# Patient Record
Sex: Female | Born: 1955 | Race: White | Hispanic: No | Marital: Married | Smoking: Never smoker
Health system: Southern US, Community
[De-identification: ages and names within clinical notes are randomized; demographics above are authoritative.]

## PROBLEM LIST (undated history)

## (undated) DIAGNOSIS — M722 Plantar fascial fibromatosis: Secondary | ICD-10-CM

## (undated) DIAGNOSIS — T8859XA Other complications of anesthesia, initial encounter: Secondary | ICD-10-CM

## (undated) DIAGNOSIS — H20813 Fuchs' heterochromic cyclitis, bilateral: Secondary | ICD-10-CM

## (undated) DIAGNOSIS — Z8742 Personal history of other diseases of the female genital tract: Secondary | ICD-10-CM

## (undated) DIAGNOSIS — Z8489 Family history of other specified conditions: Secondary | ICD-10-CM

## (undated) DIAGNOSIS — Z9889 Other specified postprocedural states: Secondary | ICD-10-CM

## (undated) DIAGNOSIS — R112 Nausea with vomiting, unspecified: Secondary | ICD-10-CM

## (undated) DIAGNOSIS — T4145XA Adverse effect of unspecified anesthetic, initial encounter: Secondary | ICD-10-CM

## (undated) HISTORY — PX: DILATION AND CURETTAGE OF UTERUS: SHX78

## (undated) HISTORY — DX: Personal history of other diseases of the female genital tract: Z87.42

## (undated) HISTORY — DX: Plantar fascial fibromatosis: M72.2

## (undated) HISTORY — DX: Fuchs' heterochromic cyclitis, bilateral: H20.813

## (undated) HISTORY — PX: OTHER SURGICAL HISTORY: SHX169

---

## 1999-07-29 ENCOUNTER — Other Ambulatory Visit: Admission: RE | Admit: 1999-07-29 | Discharge: 1999-07-29 | Payer: Self-pay | Admitting: *Deleted

## 2001-02-06 ENCOUNTER — Other Ambulatory Visit: Admission: RE | Admit: 2001-02-06 | Discharge: 2001-02-06 | Payer: Self-pay | Admitting: *Deleted

## 2001-02-10 ENCOUNTER — Encounter: Admission: RE | Admit: 2001-02-10 | Discharge: 2001-02-10 | Payer: Self-pay | Admitting: *Deleted

## 2001-02-10 ENCOUNTER — Encounter: Payer: Self-pay | Admitting: *Deleted

## 2001-02-15 DIAGNOSIS — Z8742 Personal history of other diseases of the female genital tract: Secondary | ICD-10-CM

## 2001-02-15 HISTORY — DX: Personal history of other diseases of the female genital tract: Z87.42

## 2002-03-22 ENCOUNTER — Other Ambulatory Visit: Admission: RE | Admit: 2002-03-22 | Discharge: 2002-03-22 | Payer: Self-pay | Admitting: *Deleted

## 2002-10-05 ENCOUNTER — Ambulatory Visit (HOSPITAL_BASED_OUTPATIENT_CLINIC_OR_DEPARTMENT_OTHER): Admission: RE | Admit: 2002-10-05 | Discharge: 2002-10-05 | Payer: Self-pay | Admitting: Obstetrics and Gynecology

## 2002-10-18 DIAGNOSIS — M722 Plantar fascial fibromatosis: Secondary | ICD-10-CM

## 2002-10-18 HISTORY — DX: Plantar fascial fibromatosis: M72.2

## 2003-03-27 ENCOUNTER — Other Ambulatory Visit: Admission: RE | Admit: 2003-03-27 | Discharge: 2003-03-27 | Payer: Self-pay | Admitting: *Deleted

## 2004-08-21 ENCOUNTER — Other Ambulatory Visit: Admission: RE | Admit: 2004-08-21 | Discharge: 2004-08-21 | Payer: Self-pay | Admitting: *Deleted

## 2006-12-23 ENCOUNTER — Other Ambulatory Visit: Admission: RE | Admit: 2006-12-23 | Discharge: 2006-12-23 | Payer: Self-pay | Admitting: Obstetrics & Gynecology

## 2011-03-05 NOTE — Op Note (Signed)
NAMEANAISABEL, PEDERSON                       ACCOUNT NO.:  [id number]   MEDICAL RECORD NO.:  [id number]                   PATIENT TYPE:  AMB   LOCATION:  [facility]                                 FACILITY:  [facility]   PHYSICIAN:  Laqueta Linden, M.D.                 DATE OF BIRTH:  1956-08-26   DATE OF PROCEDURE:  10/05/2002  DATE OF DISCHARGE:                                 OPERATIVE REPORT   PREOPERATIVE DIAGNOSIS:  Chronic left lower quadrant pain.   POSTOPERATIVE DIAGNOSIS:  Chronic left lower quadrant pain, left adnexal  adhesions, left uterosacral ligament endometriosis implant.   PROCEDURE:  Diagnostic laparoscopy with cauterization of implant and lysis  of adhesions.   SURGEON:  Laqueta Linden, M.D.   ANESTHESIA:  General endotracheal.   ESTIMATED BLOOD LOSS:  Less than 10 cc.   SPECIMENS:  None.   COMPLICATIONS:  None.   INDICATIONS FOR PROCEDURE:  Kamaryn Grimley is a 55 year old, gravida 3,  para 3, married white female, who has a greater than six month history of  left lower quadrant pain with associated chronic constipation as well as  dyspareunia.  She is quite tender on pelvic examination in the left aspect  of her uterus and adnexa as well as on rectal.  She is to undergo  diagnostics and hopefully therapeutic laparoscopy.  She has seen the  informed consent film with her husband.  They voiced their understanding and  accepted risks, benefits, and alternatives and complications as well as the  limitations of the procedure and the possible need for further GI evaluation  including colonoscopy postoperatively.  She voices her understanding and  acceptance of all of the risks and agrees to proceed.  She also understands  that this may not be a definitively therapeutic procedure regardless of the  findings.  The patient has received Ancef 1 g IV antibiotic prophylaxis  preoperatively.   DESCRIPTION OF PROCEDURE:  The patient was taken to the operating room and  after proper identification and consents were ascertained, she was placed on  the operating room table in the supine position.  After the induction of  general endotracheal anesthesia, she was placed in the [place] stirrups, and  the abdomen, perineum, and vagina were prepped and draped in a routine  sterile fashion.  A transurethral Foley was placed which was removed at the  conclusion of the procedure.  A Hulka tenaculum was placed on the anterior  lip of the cervix.  Attention was then turned abdominally.  A 2 cm  infraumbilical incision was made.  The Veress needle was inserted into the  peritoneal cavity.  Intraperitoneal placement was confirmed by hanging drop  and saline instillation tests.  Three liters of CO2 were then infused, and  the Veress needle was then removed.  The #10-11 disposable trocar was then  inserted without difficulty.  Inspection with the scope revealed no obvious  injury  or bleeding at the insertion site.  A 5 mm port was placed on the  right mid quadrant for an accessory port under direct vision.  Inspection of  the upper abdomen revealed smooth liver edge.  The appendix was normal in  appearance without evidence of inflammation or adhesions.  Inspection of the  pelvis revealed a freely mobilize uterus.  Completely normal right tube and  ovary.  All peritoneal surfaces were visualized, and the only area of  question was on the left uterosacral ligament where there were two adjacent  white blebs, consistent with endometriosis.  There were no peritoneal  windows or defects or any other suspicious-looking lesions.  The left tube  and ovary appeared completely normal with no evidence of endometriosis;  however, left adnexal mobility was limited by some filmy adhesions to the  left pelvic sidewall which was felt to likely be responsible for her pain.  No other abnormalities were identified.  The monopolar scissors were then  utilized and the adhesions on the left adnexa  put on stretch, and these  adhesions were sharply lysed with cautery with excellent hemostasis and  excellent freeing up of that adnexa noted.  Attention was then turned to the  endometriosis implant.  This was cauterized with the Kleppinger forceps.  The 5 mm port was then removed under direct vision.  The pneumoperitoneum  was allowed to escape, and the laparoscope was then withdrawn under direct  vision.  The incisions were sutured with 4-0 Dexon subcuticular sutures, and  Steri-Strips and pressure dressings were applied.  A total of 10 cc of 0.25%  plain Marcaine was injected into the incisions for local analgesia.  The  patient received 30 mg Toradol IV, 30 mg IM prior to the conclusion of the  procedure.  She will be observed and discharged per anesthesia protocol.  She is to follow up in the office in 4-6 weeks' time.  She is to call sooner  for excessive pain, fever, bleeding, or other concerns.  She will be given  routine verbal and written discharge instructions.  She is to take Advil or  Aleve and all routine medications and given a prescription for Percocet  5/325 dispense #20, 1-2 q.4-6h. p.r.n. pain with no refills.                                               Laqueta Linden, M.D.    LKS/MEDQ  D:  10/05/2002  T:  10/05/2002  Job:  [id number]

## 2013-03-05 ENCOUNTER — Encounter: Payer: Self-pay | Admitting: Gynecology

## 2013-03-09 ENCOUNTER — Ambulatory Visit: Payer: Self-pay | Admitting: Gynecology

## 2013-03-09 DIAGNOSIS — Z01419 Encounter for gynecological examination (general) (routine) without abnormal findings: Secondary | ICD-10-CM

## 2013-06-07 ENCOUNTER — Ambulatory Visit (INDEPENDENT_AMBULATORY_CARE_PROVIDER_SITE_OTHER): Payer: BC Managed Care – PPO | Admitting: Gynecology

## 2013-06-07 ENCOUNTER — Encounter: Payer: Self-pay | Admitting: Gynecology

## 2013-06-07 VITALS — BP 122/80 | HR 68 | Resp 12 | Ht 69.0 in | Wt 171.0 lb

## 2013-06-07 DIAGNOSIS — H20819 Fuchs' heterochromic cyclitis, unspecified eye: Secondary | ICD-10-CM | POA: Insufficient documentation

## 2013-06-07 DIAGNOSIS — H20813 Fuchs' heterochromic cyclitis, bilateral: Secondary | ICD-10-CM

## 2013-06-07 DIAGNOSIS — N952 Postmenopausal atrophic vaginitis: Secondary | ICD-10-CM | POA: Insufficient documentation

## 2013-06-07 DIAGNOSIS — N951 Menopausal and female climacteric states: Secondary | ICD-10-CM | POA: Insufficient documentation

## 2013-06-07 MED ORDER — ESTRADIOL 10 MCG VA TABS: 1.0000 | ORAL_TABLET | VAGINAL | Status: DC

## 2013-06-07 MED ORDER — CONJ ESTROGENS-BAZEDOXIFENE 0.45-20 MG PO TABS: 1.0000 | ORAL_TABLET | ORAL | Status: DC

## 2013-06-07 NOTE — Progress Notes (Signed)
Subjective:     Patient ID: Jasmin Woods, female   DOB: 1956-07-06, 57 y.o.   MRN: [id number]  HPI Comments: Pt here as follow up on Duavee HRT.  Pt reports tolerating well only questions increase in BP.Marland Kitchen One reason for ERT was history of Fuchs Heterochromic Iridocystitis, followed at [facility], that aspect is better.  Pt reports hot flashes if forgets to take rx, no vaginal bleeding. She has noticed a small improvement in vaginal dryness. Pt thinks her BP has gotten worse but reports it is not high, she also has had recurrent ear infections recently and had been on steroids and antibiotics    Review of Systems  Genitourinary: Negative for vaginal bleeding, vaginal discharge and pelvic pain.  All other systems reviewed and are negative.       Objective:   Physical Exam  Nursing note and vitals reviewed. Constitutional: She is oriented to person, place, and time. She appears well-developed and well-nourished.  Neurological: She is alert and oriented to person, place, and time.       Assessment:     menopausal symtpoms Atrophic vaginitis  Fuch's heterochromic cyclitis    Plan:     We reviewed her BP's from our office and pt assured little variability seen, slight increse may be related to her steroids from ear infection  Refill duavee Will try to prime vagina with vagifem-pt agreeable, EVOO and cocoanut oil recommended F/u with ENT as needed, do not suspect related to estrogen      63m spent >50% face to face discussing above issues,

## 2013-06-11 ENCOUNTER — Telehealth: Payer: Self-pay | Admitting: Gynecology

## 2013-06-11 NOTE — Telephone Encounter (Signed)
Patient has been put on Dauvee . She has been taking it for 3 months. She has noticed that she is sweating more,leg cramps in the middle of the night .

## 2013-06-11 NOTE — Telephone Encounter (Signed)
LMTCB 06/11/13 cm

## 2013-06-21 NOTE — Telephone Encounter (Signed)
Spoke with pt about symptoms. Pt recently put on amoxicillin 1000mg  BID and prednisone for bilat ear infections. Sweating and leg cramps occurred before addition of abx and prednisone. Pt is still hot at night, and sweats a lot on her face and head, which is unusual for her. Pt was expecting symptoms to be alleviated more by now with addition of Duavee. Pt wondering if the combination of meds could "cancel each other out." Advised prednisone could cause some sweating, but that antibiotics should not cause these symptoms. Pt is on last prednisone pill today. Any advice?

## 2013-06-25 NOTE — Telephone Encounter (Signed)
LM on pt's VM re: TL advice. Pt to call back and give update on how she is doing.

## 2013-06-25 NOTE — Telephone Encounter (Signed)
i'd watch and wait, the sweats could be related to both the medications and the infection for which she got it, the duavee should take care of the sweats so i'd wait until she know for sure that the infection is cleared

## 2013-07-03 NOTE — Telephone Encounter (Signed)
LMTCB for update.  aa

## 2013-07-04 NOTE — Telephone Encounter (Signed)
Spoke with patient. She having bilateral leg crams that wake her up at night since approximately 3 weeks after starting [facility]. Would like to know from Dr. Farrel Gobble if it is normal?.

## 2013-07-04 NOTE — Telephone Encounter (Signed)
Patient is returning your call.  

## 2013-07-06 ENCOUNTER — Telehealth: Payer: Self-pay | Admitting: *Deleted

## 2013-07-06 NOTE — Telephone Encounter (Signed)
Called to patient to follow up on leg pain.  She reports that leg cramps are intermittent and has them in both legs but not at same time. Worse at night.  Denies redness, pain or localized area of redness.She is still taking her Dianna Rossetti but wants to know if this is a common side effect and if okay to continue?

## 2013-07-10 NOTE — Telephone Encounter (Signed)
Ov to evaluate

## 2013-07-10 NOTE — Telephone Encounter (Signed)
See next phone note from 07-06-13, follow up call to patient.

## 2013-07-10 NOTE — Telephone Encounter (Signed)
VM has number confirmation, LMTCB. 

## 2013-07-11 NOTE — Telephone Encounter (Signed)
Call to patient, LMTCB

## 2013-07-11 NOTE — Telephone Encounter (Signed)
Patient returning my call.  Advised Dr Farrel Gobble recommends OV for evaluation.  Patient declines for now, mother just had knee replacement surgery and she is her caregiver.  She denies any localized area of pain swelling redness or heat and is instructed to go directly to ER if this develops.  Patient states she will call back and schedule appointment with dr lathrop mid to late October. Patient agrees to call back if symptoms worsen and is aware we can not tell her anything without evaluating her.

## 2013-09-10 ENCOUNTER — Telehealth: Payer: Self-pay

## 2013-09-10 NOTE — Telephone Encounter (Signed)
Spoke to patient. Scheduled appt

## 2013-09-10 NOTE — Telephone Encounter (Signed)
Called patient to schedule appt. (09/11/13 @ 1:30 or 3:30) no answer. Left vmail.

## 2013-09-11 ENCOUNTER — Telehealth: Payer: Self-pay | Admitting: *Deleted

## 2013-09-11 ENCOUNTER — Ambulatory Visit: Payer: Self-pay | Admitting: Neurology

## 2013-09-11 NOTE — Telephone Encounter (Signed)
Patient cancelled appt for 09/11/13 she says she was feeling worse. Called patient this afternoon to reschedule appt. She says she was feeling somewhat better and will call after Thanksgiving to reschedule.

## 2014-04-11 ENCOUNTER — Telehealth: Payer: Self-pay | Admitting: Gynecology

## 2014-04-11 NOTE — Telephone Encounter (Signed)
Left message to call Kaitlyn at [phone].  Triage any symptoms. Are there any labs that she is wanting specifically?

## 2014-04-11 NOTE — Telephone Encounter (Signed)
Spoke with patient. Patient states that she would like to have hormone levels, vitamin D, and cholesterol checked at annual with Dr.Lathrop. "My vitamin d was very low last time I was there so I want to make sure it is normal. I also just want to have everything else checked to see where I am and if there is anything I need to be doing." Advised patient will need to be fasting for labs. Patient would like to move appointment to an earlier time. Requesting Tuesday or Thursday appointment. Appointment scheduled for August 4th at 9am with Dr.Lathrop. Advised would send a message to Dr.Lathrop with lab requests. Patient would like to know if labs will be covered at this appointment. Advised patient that labs drawn with annual exam are usually covered. Patient agreeable and verbalizes understanding.  Dr.Lathrop, will you review and place labs for this patient? Thank you.  Routing to provider for final review. Patient agreeable to disposition. Will close encounter

## 2014-04-11 NOTE — Telephone Encounter (Signed)
Patient has an aex 06/05/14 @ 11:00am  with Dr.Lathrop. Patient request to have blood work done but is not sure if she will needs to fast.

## 2014-04-12 ENCOUNTER — Ambulatory Visit: Payer: BC Managed Care – PPO | Admitting: Gynecology

## 2014-05-08 ENCOUNTER — Telehealth: Payer: Self-pay | Admitting: Gynecology

## 2014-05-08 NOTE — Telephone Encounter (Signed)
Left message to call Emran Molzahn at [phone]. 

## 2014-05-08 NOTE — Telephone Encounter (Signed)
Spoke with patient. Patient states that she has been experiencing vaginal burning since 7/12. Patient denies back pain, fever,and urinary frequency but has been having vaginal discharge. Patient has annual exam on 8/18 and was hoping to have something called in for her. Advised we would have to see patient for evaluation before prescribing any medication. Advised patient could try OTC Monistat 3 or 7 and hydrocortisone ointment for relief. Patient prefers to try this first. Patient has also been experiencing "leg cramps" and is taking HRT. "I read that estrogen can cause this. It is not bad enough for me to stop taking it because that would be worse. I just didn't know if there was anything else that wouldn't cause this." Patient states that she feels "better" when she is taking the vagifem. Would like to know if she can use this more frequently or if there is something she can use in addition. Advised would send a message to Dr.Lathrop and give patient a call back with further advice and recommendations. Patient agreeable.

## 2014-05-08 NOTE — Telephone Encounter (Signed)
Pt is on oral estrogen, should be seen to rule out DVT as source of cramps.  i know she has been on for a while but should be sure.  Can check d/c at the same time, esp if she hasn't used otc yet

## 2014-05-08 NOTE — Telephone Encounter (Signed)
Patient is having a burning sensation after she uses the bathroom and she is also having leg cramps. Said she saw somewhere that the hormone she is taking can cause leg cramps so she was concerned. Has aex scheduled for 06/04/14. 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

## 2014-05-09 NOTE — Telephone Encounter (Signed)
Spoke with patient. Patient states that she has appointment for annual on 8/18. Advised patient would not want her to wait that long to be seen for this side effect. Patient agreeable. 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 Patient states that if she needs to make another appointment she would like to combine the appointments. Patient is requesting fasting lab work at annual exam. There are no current open morning slots with Dr.Lathrop where patient can be moved. Emphasized importance but patient states with scheduling she does not want to make two trips. "It is really okay. I can keep my appointment on the 18th." Advised would send a message over to Dr.Lathop regarding scheduling and give patient a call back if there is a place we can move patient to be seen sooner. Patient agreeable.

## 2014-05-09 NOTE — Telephone Encounter (Signed)
Left message to call Kaitlyn at [phone]. 

## 2014-05-10 NOTE — Telephone Encounter (Signed)
Spoke with patient. Advised of message as seen below from Dr.Lathrop. Patient agreeable. Will monitor and call our office or be seen at urgent care if symptoms worsen or if she notices any changes.  Routing to provider for final review. Patient agreeable to disposition. Will close encounter

## 2014-05-10 NOTE — Telephone Encounter (Signed)
If leg cramps get worse or if she notices a change in the color or size of her leg will need to go to urgent care, their hours might better fit her schudule

## 2014-05-10 NOTE — Telephone Encounter (Signed)
Left message to call Kaitlyn at [phone]. 

## 2014-05-21 ENCOUNTER — Ambulatory Visit: Payer: BC Managed Care – PPO | Admitting: Gynecology

## 2014-06-04 ENCOUNTER — Ambulatory Visit (INDEPENDENT_AMBULATORY_CARE_PROVIDER_SITE_OTHER): Payer: BC Managed Care – PPO | Admitting: Gynecology

## 2014-06-04 ENCOUNTER — Encounter: Payer: Self-pay | Admitting: Gynecology

## 2014-06-04 VITALS — BP 110/68 | HR 82 | Resp 16 | Ht 69.0 in | Wt 166.0 lb

## 2014-06-04 DIAGNOSIS — E559 Vitamin D deficiency, unspecified: Secondary | ICD-10-CM

## 2014-06-04 DIAGNOSIS — L608 Other nail disorders: Secondary | ICD-10-CM

## 2014-06-04 DIAGNOSIS — Z0000 Encounter for general adult medical examination without abnormal findings: Secondary | ICD-10-CM

## 2014-06-04 DIAGNOSIS — Z01419 Encounter for gynecological examination (general) (routine) without abnormal findings: Secondary | ICD-10-CM

## 2014-06-04 DIAGNOSIS — H20813 Fuchs' heterochromic cyclitis, bilateral: Secondary | ICD-10-CM

## 2014-06-04 DIAGNOSIS — Z13220 Encounter for screening for lipoid disorders: Secondary | ICD-10-CM

## 2014-06-04 DIAGNOSIS — Z8619 Personal history of other infectious and parasitic diseases: Secondary | ICD-10-CM

## 2014-06-04 DIAGNOSIS — N952 Postmenopausal atrophic vaginitis: Secondary | ICD-10-CM

## 2014-06-04 DIAGNOSIS — H20819 Fuchs' heterochromic cyclitis, unspecified eye: Secondary | ICD-10-CM

## 2014-06-04 DIAGNOSIS — N951 Menopausal and female climacteric states: Secondary | ICD-10-CM

## 2014-06-04 DIAGNOSIS — L603 Nail dystrophy: Secondary | ICD-10-CM

## 2014-06-04 LAB — HEMOGLOBIN, FINGERSTICK: HEMOGLOBIN, FINGERSTICK: 13.8 g/dL (ref 12.0–16.0)

## 2014-06-04 LAB — POCT URINALYSIS DIPSTICK
UROBILINOGEN UA: NEGATIVE
pH, UA: 5

## 2014-06-04 LAB — CBC WITH DIFFERENTIAL/PLATELET
BASOS ABS: 0 10*3/uL (ref 0.0–0.1)
Basophils Relative: 0 % (ref 0–1)
Eosinophils Absolute: 0 10*3/uL (ref 0.0–0.7)
Eosinophils Relative: 1 % (ref 0–5)
HEMATOCRIT: 40.5 % (ref 36.0–46.0)
HEMOGLOBIN: 13.8 g/dL (ref 12.0–15.0)
Lymphocytes Relative: 41 % (ref 12–46)
Lymphs Abs: 2 10*3/uL (ref 0.7–4.0)
MCH: 31.5 pg (ref 26.0–34.0)
MCHC: 34.1 g/dL (ref 30.0–36.0)
MCV: 92.5 fL (ref 78.0–100.0)
MONO ABS: 0.4 10*3/uL (ref 0.1–1.0)
Monocytes Relative: 9 % (ref 3–12)
NEUTROS ABS: 2.4 10*3/uL (ref 1.7–7.7)
Neutrophils Relative %: 49 % (ref 43–77)
Platelets: 197 10*3/uL (ref 150–400)
RBC: 4.38 MIL/uL (ref 3.87–5.11)
RDW: 13 % (ref 11.5–15.5)
WBC: 4.8 10*3/uL (ref 4.0–10.5)

## 2014-06-04 LAB — COMPREHENSIVE METABOLIC PANEL
ALK PHOS: 45 U/L (ref 39–117)
ALT: 12 U/L (ref 0–35)
AST: 21 U/L (ref 0–37)
Albumin: 4.5 g/dL (ref 3.5–5.2)
BUN: 14 mg/dL (ref 6–23)
CO2: 30 mEq/L (ref 19–32)
Calcium: 8.9 mg/dL (ref 8.4–10.5)
Chloride: 101 mEq/L (ref 96–112)
Creat: 0.74 mg/dL (ref 0.50–1.10)
GLUCOSE: 90 mg/dL (ref 70–99)
POTASSIUM: 4.3 meq/L (ref 3.5–5.3)
SODIUM: 138 meq/L (ref 135–145)
TOTAL PROTEIN: 6.7 g/dL (ref 6.0–8.3)
Total Bilirubin: 0.5 mg/dL (ref 0.2–1.2)

## 2014-06-04 LAB — LIPID PANEL
Cholesterol: 231 mg/dL — ABNORMAL HIGH (ref 0–200)
HDL: 68 mg/dL (ref >39)
LDL CALC: 139 mg/dL — AB (ref 0–99)
TRIGLYCERIDES: 119 mg/dL (ref <150)
Total CHOL/HDL Ratio: 3.4 Ratio
VLDL: 24 mg/dL (ref 0–40)

## 2014-06-04 LAB — TSH: TSH: 1.531 u[IU]/mL (ref 0.350–4.500)

## 2014-06-04 MED ORDER — CONJ ESTROGENS-BAZEDOXIFENE 0.45-20 MG PO TABS: 1.0000 | ORAL_TABLET | ORAL | Status: DC

## 2014-06-04 MED ORDER — ESTRADIOL 10 MCG VA TABS: ORAL_TABLET | VAGINAL | Status: DC

## 2014-06-04 NOTE — Patient Instructions (Signed)
Try luteva for vaginal dryness

## 2014-06-04 NOTE — Progress Notes (Signed)
58 y.o. Married Caucasian female   [phone] here for annual exam.  She does report rare hot flashes, does have rare, mild night sweats, does have vaginal dryness.  She is using lubricants, Vagifem.  Pt is doing deep water aerobics 5x/w, teaches class at minimum 1h/class.  Pt reports post coital bleeding-red when wipes, no flow, lasts several hours, pain with sex, no discharge.  No bleeding without sex.  Patient's last menstrual period was 08/07/2012.          Sexually active: Yes.    The current method of family planning is post menopausal status.    Exercising: Yes.    water aerobics Last pap: 08/16/12 NEG HR HPV Abnormal PAP: no Mammogram:  2006 Normal  BSE: no Colonoscopy: never had one DEXA: never had one Alcohol: no Tobacco: no  Hgb: 13.8  ; Urine:  Leuks 2  Health Maintenance  Topic Date Due  . Pap Smear  06/08/1976  . Mammogram  06/08/2008  . Colonoscopy  06/08/2008  . Tetanus/tdap  03/18/2013  . Influenza Vaccine  05/18/2014    No family history on file.  Patient Active Problem List   Diagnosis Date Noted  . Fuchs' heterochromic cyclitis 06/07/2013  . Menopausal symptom 06/07/2013  . Atrophic vaginitis 06/07/2013    Past Medical History  Diagnosis Date  . History of ovarian cyst 5/02  . History of dysmenorrhea   . Plantar fasciitis 2004    Past Surgical History  Procedure Laterality Date  . Colon surgery    . Other surgical history Left     Dx Scope LOA cautery     Allergies: Sulfa antibiotics and Erythromycin  Current Outpatient Prescriptions  Medication Sig Dispense Refill  . amoxicillin (AMOXIL) 500 MG capsule Take 500 mg by mouth 2 (two) times daily.      Marland Kitchen. CALCIUM PO Take by mouth.      . Cholecalciferol (VITAMIN D3) 3000 UNITS TABS Take by mouth.      Isac Sarna. Conj Estrogens-Bazedoxifene (DUAVEE) 0.45-20 MG TABS Take 1 tablet by mouth 1 day or 1 dose.  30 tablet  9  . Estradiol (VAGIFEM) 10 MCG TABS vaginal tablet Place 1 tablet (10 mcg total) vaginally  2 (two) times a week. Use every night before bed for two weeks when you first begin this medicine, then after the first two weeks, begin using it twice a week.  18 tablet  12  . Multiple Vitamins-Minerals (MULTIVITAMIN PO) Take by mouth.       No current facility-administered medications for this visit.    ROS: Pertinent items are noted in HPI.  Exam:    LMP 08/07/2012 Weight change: @WEIGHTCHANGE @ Last 3 height recordings:  Ht Readings from Last 3 Encounters:  06/07/13 5\' 9"  (1.753 m)   General appearance: alert, cooperative and appears stated age Head: Normocephalic, without obvious abnormality, atraumatic Neck: no adenopathy, no carotid bruit, no JVD, supple, symmetrical, trachea midline and thyroid not enlarged, symmetric, no tenderness/mass/nodules Lungs: clear to auscultation bilaterally Breasts: normal appearance, no masses or tenderness Heart: regular rate and rhythm, S1, S2 normal, no murmur, click, rub or gallop Abdomen: soft, non-tender; bowel sounds normal; no masses,  no organomegaly Extremities: extremities normal, atraumatic, no cyanosis or edema Skin: Skin color, texture, turgor normal. No rashes or lesions Lymph nodes: Cervical, supraclavicular, and axillary nodes normal. no inguinal nodes palpated Neurologic: Grossly normal   Pelvic: External genitalia:  no lesions  Urethra: normal appearing urethra with no masses, tenderness or lesions              Bartholins and Skenes: Bartholin's, Urethra, Skene's normal                 Vagina: normal appearing vagina with normal color and discharge, no lesions, tight introitus              Cervix: normal appearance              Pap taken: No.        Bimanual Exam:  Uterus:  uterus is normal size, shape, consistency and nontender                                      Adnexa:    normal adnexa in size, nontender and no masses                                      Rectovaginal: Confirms                                       Anus:  normal sphincter tone, no lesions    1. Routine gynecological examination counseled on breast self exam, mammography screening, use and side effects of HRT, menopause, adequate intake of calcium and vitamin D, diet and exercise return annually or prn Discussed PAP guideline changes, importance of weight bearing exercises, calcium, vit D and balanced diet.  2. Laboratory examination ordered as part of a routine general medical examination  - POCT Urinalysis Dipstick - Hemoglobin, fingerstick - TSH - Comprehensive metabolic panel  3. Unspecified vitamin D deficiency  - Vit D  25 hydroxy (rtn osteoporosis monitoring)  4. Atrophic vaginitis Some of her vaginal dryness could be related to her time in the pool, suggest she increase her vagifem to 3x/w and try otc luteva as daily vaginal moisturizer - Conj Estrogens-Bazedoxifene (DUAVEE) 0.45-20 MG TABS; Take 1 tablet by mouth 1 day or 1 dose.  Dispense: 90 tablet; Refill: 3 - Estradiol (VAGIFEM) 10 MCG TABS vaginal tablet; Place in vagina 3x/week  Dispense: 36 tablet; Refill: 3  5. Screening cholesterol level  - Lipid panel  6. 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 Menopausal symptom  - Conj Estrogens-Bazedoxifene (DUAVEE) 0.45-20 MG TABS; Take 1 tablet by mouth 1 day or 1 dose.  Dispense: 90 tablet; Refill: 3  8. Fuchs' heterochromic cyclitis of eye, bilateral  - Conj Estrogens-Bazedoxifene (DUAVEE) 0.45-20 MG TABS; Take 1 tablet by mouth 1 day or 1 dose.  Dispense: 90 tablet; Refill: 3  9. Koilonychia Recent change over the past year CBC to rule out iron deficiency, if normal f/u with pcp   An After Visit Summary was printed and given to the patient.

## 2014-06-05 ENCOUNTER — Ambulatory Visit: Payer: BC Managed Care – PPO | Admitting: Gynecology

## 2014-06-05 LAB — VITAMIN D 25 HYDROXY (VIT D DEFICIENCY, FRACTURES): VIT D 25 HYDROXY: 53 ng/mL (ref 30–89)

## 2014-06-06 ENCOUNTER — Telehealth: Payer: Self-pay | Admitting: *Deleted

## 2014-06-06 NOTE — Telephone Encounter (Addendum)
Patient calling wanting Dr. Farrel Woods to add the blood test that lets her know why she keeps getting shingles.   I s/w patient yesterday when I notified her of her labs. She was concerned then, I told her according to Dr. Odette HornsLathrop's AEX note patient was to f/u with her PCP about her shingles. Patient stated that her insurance will not cover any additional labs. But that she understands and will f/u with them.   Told patient that I don't think there is a test that we can draw here concerning that and that's something that her PCP would be able to assist her in. Patient understand this but still insists that I still ask Dr.Lathrop.  I told her that Dr. Farrel Woods is out of the office today, but will be in tomorrow and that I will send her a message and someone will get back with her.  Please advise Dr. Farrel Woods.

## 2014-06-09 ENCOUNTER — Other Ambulatory Visit: Payer: Self-pay | Admitting: Gynecology

## 2014-06-10 NOTE — Telephone Encounter (Signed)
Last AEX: 06/04/14 Last refill: 06/04/14 #90, 3ref Current AEX:NS  Pt was given new rx on 06/04/14 Encounter closed

## 2014-08-02 NOTE — Telephone Encounter (Signed)
Dr. Farrel GobbleLathrop have you contacted this patient?

## 2014-08-19 ENCOUNTER — Encounter: Payer: Self-pay | Admitting: Gynecology

## 2015-04-17 ENCOUNTER — Other Ambulatory Visit: Payer: Self-pay | Admitting: *Deleted

## 2015-04-17 DIAGNOSIS — N952 Postmenopausal atrophic vaginitis: Secondary | ICD-10-CM

## 2015-04-17 NOTE — Telephone Encounter (Signed)
Medication refill request: Vagifem 10 MCG Last AEX:  06-04-14  Next AEX: not scheduled Last MMG (if hormonal medication request): 2006 WNL Refill authorized: please advise

## 2015-04-22 NOTE — Telephone Encounter (Signed)
Please notify pt that her MMG is overdue and AEX needs to be scheduled.  I declined RFs due to no MMG on file for pt since going on EPIC.  I can correct this if this is a mistake.  No hormonal RFs until MMG completed.

## 2015-04-29 NOTE — Telephone Encounter (Signed)
I spoke with patient and got her set up for her AEX on 06/12/15. She refuses to have a mammogram. Please advise on refill. -eh

## 2015-04-29 NOTE — Telephone Encounter (Signed)
As pt is going to see Dr. Oscar LaJertson for follow-up, I will defer doing the vagifem RFs and will defer to her.

## 2015-04-29 NOTE — Telephone Encounter (Signed)
I left patient a message to return call 04-28-15 -eh

## 2015-04-30 ENCOUNTER — Other Ambulatory Visit: Payer: Self-pay | Admitting: Obstetrics and Gynecology

## 2015-04-30 DIAGNOSIS — N952 Postmenopausal atrophic vaginitis: Secondary | ICD-10-CM

## 2015-04-30 MED ORDER — ESTRADIOL 10 MCG VA TABS: ORAL_TABLET | VAGINAL | Status: DC

## 2015-04-30 NOTE — Telephone Encounter (Signed)
Patient aware that we refilled her Vagifem  until she comes in. -eh

## 2015-06-12 ENCOUNTER — Ambulatory Visit (INDEPENDENT_AMBULATORY_CARE_PROVIDER_SITE_OTHER): Payer: BLUE CROSS/BLUE SHIELD | Admitting: Obstetrics and Gynecology

## 2015-06-12 ENCOUNTER — Encounter: Payer: Self-pay | Admitting: Obstetrics and Gynecology

## 2015-06-12 VITALS — BP 110/74 | HR 88 | Resp 18 | Ht 69.25 in | Wt 170.8 lb

## 2015-06-12 DIAGNOSIS — Z0000 Encounter for general adult medical examination without abnormal findings: Secondary | ICD-10-CM

## 2015-06-12 DIAGNOSIS — H20813 Fuchs' heterochromic cyclitis, bilateral: Secondary | ICD-10-CM

## 2015-06-12 DIAGNOSIS — Z01419 Encounter for gynecological examination (general) (routine) without abnormal findings: Secondary | ICD-10-CM | POA: Diagnosis not present

## 2015-06-12 DIAGNOSIS — Z1211 Encounter for screening for malignant neoplasm of colon: Secondary | ICD-10-CM | POA: Diagnosis not present

## 2015-06-12 DIAGNOSIS — Z124 Encounter for screening for malignant neoplasm of cervix: Secondary | ICD-10-CM | POA: Diagnosis not present

## 2015-06-12 DIAGNOSIS — Z8639 Personal history of other endocrine, nutritional and metabolic disease: Secondary | ICD-10-CM

## 2015-06-12 LAB — POCT URINALYSIS DIPSTICK
PH UA: 5
UROBILINOGEN UA: NEGATIVE

## 2015-06-12 LAB — LIPID PANEL
Cholesterol: 217 mg/dL — ABNORMAL HIGH (ref 125–200)
HDL: 66 mg/dL (ref >=46)
LDL CALC: 122 mg/dL (ref <130)
Total CHOL/HDL Ratio: 3.3 Ratio (ref <=5.0)
Triglycerides: 143 mg/dL (ref <150)
VLDL: 29 mg/dL (ref <30)

## 2015-06-12 LAB — THYROID PANEL WITH TSH
Free Thyroxine Index: 1.8 (ref 1.4–3.8)
T3 Uptake: 28 % (ref 22–35)
T4 TOTAL: 6.4 ug/dL (ref 4.5–12.0)
TSH: 1.595 u[IU]/mL (ref 0.350–4.500)

## 2015-06-12 NOTE — Progress Notes (Signed)
59 y.o. [id number] MarriedCaucasianF here for annual exam.  The patient is on [facility] for HRT secondary to vasomotor symptoms and Fuchs' disease, it helps the moisture in her eyes. She is always warm at night, no significant vasomotor symptoms. No vaginal bleeding. No longer having post coital bleeding. Sexually active, dry, some help with vagifem and lubricant.    Patient's last menstrual period was 08/07/2012.          Sexually active: Yes.    The current method of family planning is post menopausal status.    Exercising: Yes.    deep water aerobics 3x/wk Smoker:  no  Health Maintenance: Pap:  08/16/12 wnl neg hr hpv History of abnormal Pap:  no MMG:  Age 12 had ultrasound turned out to be normal Colonoscopy:  Never had one BMD:   Never had one TDaP: 03/19/2003 Screening Labs: , Hb today: no, Urine today: wbc's +   reports that she has never smoked. She does not have any smokeless tobacco history on file. She reports that she does not drink alcohol or use illicit drugs.  Past Medical History  Diagnosis Date  . History of ovarian cyst 5/02  . History of dysmenorrhea   . Plantar fasciitis 2004  . Fuchs' heterochromic cyclitis of both eyes     Past Surgical History  Procedure Laterality Date  . Colon surgery    . Other surgical history Left     Dx Scope LOA cautery     Current Outpatient Prescriptions  Medication Sig Dispense Refill  . B Complex Vitamins (B COMPLEX PO) Take by mouth.    . Cholecalciferol (VITAMIN D3) 5000 UNITS TABS Take by mouth.    Adalberto Ill Estrogens-Bazedoxifene (DUAVEE) 0.45-20 MG TABS Take 1 tablet by mouth 1 day or 1 dose. 90 tablet 3  . Ginkgo Biloba (GINKOBA PO) Take by mouth.    . Estradiol (VAGIFEM) 10 MCG TABS vaginal tablet Place in vagina 2 x a week at bedtime (Patient not taking: Reported on 06/12/2015) 8 tablet 1   No current facility-administered medications for this visit.    History reviewed. No pertinent family history.  ROS:  Pertinent items  are noted in HPI.  Otherwise, a comprehensive ROS was negative.  Exam:   BP 110/74 mmHg  Pulse 88  Resp 18  Ht 5' 9.25" (1.759 m)  Wt 170 lb 12.8 oz (77.474 kg)  BMI 25.04 kg/m2  LMP 08/07/2012  Weight change: _0 @ Height:   Height: 5' 9.25" (175.9 cm)  Ht Readings from Last 3 Encounters:  06/12/15 5' 9.25" (1.759 m)  06/04/14 _1  (1.753 m)  06/07/13 _2  (1.753 m)    General appearance: alert, cooperative and appears stated age Head: Normocephalic, without obvious abnormality, atraumatic Neck: no adenopathy, supple, symmetrical, trachea midline and thyroid normal to inspection and palpation Lungs: clear to auscultation bilaterally Breasts: normal appearance, no masses or tenderness, left nipple inverted, no change Heart: regular rate and rhythm Abdomen: soft, non-tender; bowel sounds normal; no masses,  no organomegaly Extremities: extremities normal, atraumatic, no cyanosis or edema Skin: Skin color, texture, turgor normal. No rashes or lesions Lymph nodes: Cervical, supraclavicular, and axillary nodes normal. No abnormal inguinal nodes palpated Neurologic: Grossly normal   Pelvic: External genitalia:  no lesions              Urethra:  normal appearing urethra with no masses, tenderness or lesions              Bartholins and Skenes:  normal                 Vagina: normal appearing vagina with normal color and discharge, no lesions              Cervix: no lesions              Pap taken: Yes.   Bimanual Exam:  Uterus:  normal size, contour, position, consistency, mobility, non-tender and anteverted              Adnexa: normal adnexa and no mass, fullness, tenderness               Rectovaginal: Confirms               Anus:  normal sphincter tone, no lesions  Chaperone was present for exam.  A:  Well Woman with normal exam  P:   Recommended colonoscopy, she is not sure, given stool guiac testing kit  Mammogram   Will call in 3 months of Duavee, once mammogram  back, will call in the rest of the year  Thyroid panel (h/o thyroid dysfunction in the past)  Lipids

## 2015-06-16 ENCOUNTER — Encounter: Payer: Self-pay | Admitting: Obstetrics and Gynecology

## 2015-06-16 DIAGNOSIS — H20813 Fuchs' heterochromic cyclitis, bilateral: Secondary | ICD-10-CM | POA: Insufficient documentation

## 2015-06-16 LAB — IPS PAP TEST WITH HPV

## 2015-06-17 ENCOUNTER — Telehealth: Payer: Self-pay | Admitting: *Deleted

## 2015-06-17 NOTE — Telephone Encounter (Signed)
I spoke with patient and answered her questions in regards to her TSH test-eh

## 2015-06-17 NOTE — Telephone Encounter (Signed)
Left message for patient to call in regards to lab results and her question she had about the TSH results -eh

## 2015-06-26 ENCOUNTER — Telehealth: Payer: Self-pay | Admitting: *Deleted

## 2015-06-26 DIAGNOSIS — N950 Postmenopausal bleeding: Secondary | ICD-10-CM

## 2015-06-26 NOTE — Telephone Encounter (Signed)
Spoke with patient. Patient states that she began to have spotting after her aex appointment on 06/12/2015. States over the weekend she began to have increased cramping. Began to have bleeding on Sunday 06/22/2015. Blood was dark brown and has continued though today. Bleeding is now bright red. Is still experiencing mild cramping. States that bleeding is a "light flow." LMP was 3 years ago. Advised patient she will need to be seen in office for further evaluation. Advised may need EMB for further evaluation of PMB. Patient is frustrated as she was just seen with our office. Advised I understand her frustration but we want to make sure there is nothing further causing her to have the bleeding she is experiencing. Patient is agreeable. Offered patient an appointment today or Monday with Dr.Jertson. Patient states she will need to see if she can come in today and return call. Advised I will let Dr.Jertson know and will await return call to schedule.

## 2015-06-26 NOTE — Telephone Encounter (Signed)
Patient called saying after 3 years she has started her period. Period started Sunday had dark bleeding now she is having bright red bleeding. Would like to know what she should do.

## 2015-06-26 NOTE — Telephone Encounter (Signed)
Routing to Dr.Jertson as FYI. Will keep in inbox to make sure she gets scheduled.

## 2015-06-26 NOTE — Telephone Encounter (Signed)
Patient called back she said she is not able to come in today at 4:00 will give [place] a call sometime next week to schedule.

## 2015-06-26 NOTE — Telephone Encounter (Signed)
I agree, please hold her chart until she is seen. Thanks

## 2015-07-01 NOTE — Telephone Encounter (Signed)
Left message to call Krysten Veronica at [phone]. 

## 2015-07-04 ENCOUNTER — Other Ambulatory Visit: Payer: Self-pay | Admitting: Obstetrics and Gynecology

## 2015-07-07 NOTE — Telephone Encounter (Signed)
Medication refill request: Jasmin Woods Last AEX:  06/12/15 JJ Next AEX: 06/17/16 JJ Last MMG (if hormonal medication request): ? Refill authorized: 06/04/14 #[id number]/ 3R.  Will call in 3 months of Duavee, once mammogram back, will call in the rest of the year.  Rx denied

## 2015-07-08 ENCOUNTER — Telehealth: Payer: Self-pay | Admitting: Obstetrics and Gynecology

## 2015-07-08 ENCOUNTER — Other Ambulatory Visit: Payer: Self-pay

## 2015-07-08 DIAGNOSIS — Z1231 Encounter for screening mammogram for malignant neoplasm of breast: Secondary | ICD-10-CM

## 2015-07-08 NOTE — Telephone Encounter (Signed)
Return call to Tracy. °

## 2015-07-08 NOTE — Telephone Encounter (Signed)
Spoke with patient. Reviewed benefit for procedure. Patient understood and agreeable. Answered all benefit related questions. Verified arrival date/time for procedure. Patient agreeable. Ok to close.

## 2015-07-08 NOTE — Telephone Encounter (Signed)
I called in 3 months of her Duavee at her annual because I wanted to have her mammogram results prior to sending in the rest of the year. Her bleeding, sounds like more than you would expect from irritation from an estrace applicator. I really think she should be further evaluated. This can start with an ultrasound to check her endometrial stripe. Hormones are contraindicated with undiagnosed vaginal bleeding.

## 2015-07-08 NOTE — Telephone Encounter (Signed)
Spoke with patient. Patient states that she has not had any further bleeding since 9/8. LMP was three years ago. Declines to be seen for further evaluation. States she started to use Estrogen cream around the time she started having vaginal bleeding. "I had not used it in a long time and I think that is what it was." Is now requesting a refill of her [facility] prescription. States Dr.Jertson was going to send this in at her aex, but it was not sent. Patient is scheduled for her mammogram in October. Advised I will speak with Dr.Jertson regarding refill request and return call. Patient is agreeable.

## 2015-07-08 NOTE — Telephone Encounter (Signed)
Spoke with patient. Advised of message as seen below from Dr.Jertson. Patient states the rx that was sent in was Vagifem and she has been using Duavee. Advised new rx can not be sent in until she is seen for further evaluation of bleeding. Advised hormones are contraindicated with undiagnosed vaginal bleeding. Patient is agreeable to schedule PUS. Appointment scheduled for 9/22 at 1 pm with 1:30 pm consult with Dr.Jertson. Patient is agreeable and verbalizes understanding. Order placed for precert.  Routing to provider for final review. Patient agreeable to disposition. Will close encounter.

## 2015-07-10 ENCOUNTER — Ambulatory Visit (INDEPENDENT_AMBULATORY_CARE_PROVIDER_SITE_OTHER): Payer: BLUE CROSS/BLUE SHIELD | Admitting: Obstetrics and Gynecology

## 2015-07-10 ENCOUNTER — Ambulatory Visit (INDEPENDENT_AMBULATORY_CARE_PROVIDER_SITE_OTHER): Payer: BLUE CROSS/BLUE SHIELD

## 2015-07-10 ENCOUNTER — Encounter: Payer: Self-pay | Admitting: Obstetrics and Gynecology

## 2015-07-10 VITALS — BP 112/80 | HR 80 | Resp 14 | Wt 169.0 lb

## 2015-07-10 DIAGNOSIS — H20813 Fuchs' heterochromic cyclitis, bilateral: Secondary | ICD-10-CM

## 2015-07-10 DIAGNOSIS — N950 Postmenopausal bleeding: Secondary | ICD-10-CM | POA: Diagnosis not present

## 2015-07-10 DIAGNOSIS — N952 Postmenopausal atrophic vaginitis: Secondary | ICD-10-CM

## 2015-07-10 DIAGNOSIS — N951 Menopausal and female climacteric states: Secondary | ICD-10-CM | POA: Diagnosis not present

## 2015-07-10 MED ORDER — CONJ ESTROGENS-BAZEDOXIFENE 0.45-20 MG PO TABS
1.0000 | ORAL_TABLET | ORAL | Status: DC
Start: 2015-07-10 — End: 2015-08-21

## 2015-07-10 NOTE — Progress Notes (Signed)
      S: the patient has had some PMP bleeding. U/S today concerning for endometrial polyp.  The patient's sister in law committed suicide 2 days ago, the patient is very upset, worried about her nieces and nephew. Her husband is in the hospital with kidney stones.  O: General: very teary Blood pressure 112/80, pulse 80, resp. rate 14, weight 169 lb (76.658 kg), last menstrual period 08/07/2012.  A/p PMP bleeding, ultrasound concerning for endometrial polyps  Ultrasound pictures reviewed with the patient  The patient will return for a sonohysterogram (in about 3 weeks, needs to focus on her family right now)  She was offered Ambien for sleep, declined.

## 2015-07-22 ENCOUNTER — Ambulatory Visit: Payer: Self-pay

## 2015-07-25 ENCOUNTER — Telehealth: Payer: Self-pay | Admitting: Obstetrics and Gynecology

## 2015-07-25 DIAGNOSIS — N951 Menopausal and female climacteric states: Secondary | ICD-10-CM

## 2015-07-25 NOTE — Telephone Encounter (Signed)
Called patient to review benefits for procedure. Left voicemail to call back and review. °

## 2015-08-11 NOTE — Telephone Encounter (Signed)
Spoke with patient. [facility]. Patient would like to know if she had this performed in 2013. Advised I will need to pull her paper chart for review and return call with further information regarding previous appointments. Patient is agreeable.

## 2015-08-11 NOTE — Telephone Encounter (Signed)
Spoke with patient. Advised I have pulled her paper chart for review and she did have a SHGM and EMB on 08/23/2012. Ad[facility]. Patient is agreeable. Advised she may resume her normal daily activities including work after having a SHGM. Patient is agreeable.  Routing to provider for final review. Patient agreeable to disposition. Will close encounter.

## 2015-08-11 NOTE — Telephone Encounter (Signed)
Spoke with patient and reviewed benefit for sonohystogram. Patient understood and agreeable. Patient had questions about procedure. [facility]. Patient aware of 72 hour cancellation policy with $150 fee.   Routing to [place] to enter order.

## 2015-08-21 ENCOUNTER — Ambulatory Visit (INDEPENDENT_AMBULATORY_CARE_PROVIDER_SITE_OTHER): Payer: BLUE CROSS/BLUE SHIELD | Admitting: Obstetrics and Gynecology

## 2015-08-21 ENCOUNTER — Encounter: Payer: Self-pay | Admitting: Obstetrics and Gynecology

## 2015-08-21 ENCOUNTER — Ambulatory Visit (INDEPENDENT_AMBULATORY_CARE_PROVIDER_SITE_OTHER): Payer: BLUE CROSS/BLUE SHIELD

## 2015-08-21 VITALS — BP 96/60 | HR 72 | Resp 14 | Wt 169.0 lb

## 2015-08-21 DIAGNOSIS — N952 Postmenopausal atrophic vaginitis: Secondary | ICD-10-CM

## 2015-08-21 DIAGNOSIS — N950 Postmenopausal bleeding: Secondary | ICD-10-CM

## 2015-08-21 DIAGNOSIS — Z780 Asymptomatic menopausal state: Secondary | ICD-10-CM | POA: Diagnosis not present

## 2015-08-21 DIAGNOSIS — N951 Menopausal and female climacteric states: Secondary | ICD-10-CM

## 2015-08-21 MED ORDER — ESTRADIOL 10 MCG VA TABS: ORAL_TABLET | VAGINAL | Status: DC

## 2015-08-21 MED ORDER — PROGESTERONE MICRONIZED 100 MG PO CAPS: ORAL_CAPSULE | ORAL | Status: DC

## 2015-08-21 MED ORDER — ESTRADIOL 0.05 MG/24HR TD PTWK: 0.0500 mg | MEDICATED_PATCH | TRANSDERMAL | Status: DC

## 2015-08-21 NOTE — Addendum Note (Signed)
Addended by: Joeseph AmorFAST, Lynwood Kubisiak L on: 08/21/2015 08:03 AM   Modules accepted: Orders

## 2015-08-21 NOTE — Progress Notes (Signed)
    S: She is here for evaluation of PMP bleeding. Previously had an ultrasound, sonohysterogram today.  She is interested in changing from the duavee to estrogen and progesterone. Also needs more vagifem. She had to cancel her mammogram secondary to a death in the family, she will reschedule it.   Past Medical History  Diagnosis Date  . History of ovarian cyst 5/02  . History of dysmenorrhea   . Plantar fasciitis 2004  . Fuchs' heterochromic cyclitis of both eyes    Past Surgical History  Procedure Laterality Date  . Colon surgery    . Other surgical history Left     Dx Scope LOA cautery    Current Outpatient Prescriptions on File Prior to Visit  Medication Sig Dispense Refill  . B Complex Vitamins (B COMPLEX PO) Take by mouth.    . Cholecalciferol (VITAMIN D3) 5000 UNITS TABS Take by mouth.    . Ginkgo Biloba (GINKOBA PO) Take by mouth.     No current facility-administered medications on file prior to visit.     Review of Systems  All other systems reviewed and are negative.  O: General: Alert caucasian female in NAD Blood pressure 96/60, pulse 72, resp. rate 14, weight 169 lb (76.658 kg), last menstrual period 08/07/2012. Pelvic: normal external genitalia, normal vaginal mucosa, cervix without lesions  Sonohysterogram The procedure and risks of the procedure were reviewed with the patient, consent form was signed. A speculum was placed in the vagina and the cervix was cleansed with betadine. The sonohysterogram catheter was inserted into the uterine cavity without difficulty. Saline was infused under direct observation with the ultrasound. No intracavitary defects were noted.The catheter was removed.   A/P 1) PMP bleeding, sonohysterogram with endometrial polyp  -will set up the patient for a hysteroscopy, polypectomy, D&C  -return for a pre-op 2)HRT, wants to change from the duavee  -discussed options, will start an estradiol patch and prometrium qhs (should help her  sleep)    Spent 20 minutes face to face with the patient, 50% in counselling

## 2015-09-19 ENCOUNTER — Telehealth: Payer: Self-pay | Admitting: *Deleted

## 2015-09-19 NOTE — Telephone Encounter (Signed)
Follow-up call to patient regarding plans for scheduling surgery. Patient initially states she may need to wait a month or two. Call to patient for update. Patient with several questions regarding reason to proceed with procedure if "just a polyp." asking difference between this procedure and procedure in 2013. Chart [facility] Dr [facility]ertson[facility], no polyp seen. Patient notified of this. Ultrasound now reveals polyp , recommend removal and D&C for complete evaluation.  Advised with PMB, abnormal cells must be ruled out.    Advised do not recommend delay procedure more than 1-2 months as if there are any abnormal cells, delay in diagnosis could result in need more more extensive treatment. Patient will consider this and look toward scheduling in the next month or early 2017.  Request call back from business office.  Routing to provider for final review. Patient agreeable to disposition. Will close encounter.

## 2015-11-10 ENCOUNTER — Ambulatory Visit
Admission: RE | Admit: 2015-11-10 | Discharge: 2015-11-10 | Disposition: A | Discharge status: Home / Self Care | Payer: BLUE CROSS/BLUE SHIELD | Source: Ambulatory Visit

## 2015-11-10 DIAGNOSIS — Z1231 Encounter for screening mammogram for malignant neoplasm of breast: Secondary | ICD-10-CM

## 2015-11-11 ENCOUNTER — Telehealth: Payer: Self-pay | Admitting: *Deleted

## 2015-11-11 NOTE — Telephone Encounter (Signed)
See previous phone encounters regarding surgery. Follow-up call to patient. Left message to call back.

## 2015-11-12 ENCOUNTER — Other Ambulatory Visit: Payer: Self-pay | Admitting: Obstetrics and Gynecology

## 2015-11-12 DIAGNOSIS — R928 Other abnormal and inconclusive findings on diagnostic imaging of breast: Secondary | ICD-10-CM

## 2015-11-14 NOTE — Telephone Encounter (Signed)
Returned call to [place].

## 2015-11-18 ENCOUNTER — Ambulatory Visit
Admission: RE | Admit: 2015-11-18 | Discharge: 2015-11-18 | Disposition: A | Discharge status: Home / Self Care | Payer: BLUE CROSS/BLUE SHIELD | Source: Ambulatory Visit | Attending: Obstetrics and Gynecology | Admitting: Obstetrics and Gynecology

## 2015-11-18 DIAGNOSIS — R928 Other abnormal and inconclusive findings on diagnostic imaging of breast: Secondary | ICD-10-CM

## 2015-11-19 NOTE — Telephone Encounter (Signed)
Return call to patient:  Message left to return call to [place] at [phone].   Following up from office visit with Dr. Oscar La Plan:  1) PMP bleeding, sonohysterogram with endometrial polyp -will set up the patient for a hysteroscopy, polypectomy, D&C -return for a pre-op

## 2015-11-20 ENCOUNTER — Other Ambulatory Visit: Payer: Self-pay | Admitting: Emergency Medicine

## 2015-11-20 DIAGNOSIS — N952 Postmenopausal atrophic vaginitis: Secondary | ICD-10-CM

## 2015-11-20 NOTE — Telephone Encounter (Signed)
The patient has an endometrial polyp, surgery has been delayed. Please call in one month of HRT and get her surgery on the books. Please explain to the patient that there is a small chance of endometrial cancer and I don't want to keep giving her HRT and not evaluating the polyp.

## 2015-11-20 NOTE — Telephone Encounter (Signed)
I'm worried about

## 2015-11-20 NOTE — Telephone Encounter (Signed)
Patient returning your call. [phone]

## 2015-11-20 NOTE — Telephone Encounter (Signed)
Call to patient regarding surgical plan (see open telephone encounter) She has had follow up mammogram and normal results. Requests refill of HRT in 90 day supply to [facility].   Last annual exam 06/12/15 Mammogram 11/18/15 recall in 6 months

## 2015-11-21 ENCOUNTER — Telehealth: Payer: Self-pay | Admitting: Obstetrics and Gynecology

## 2015-11-21 NOTE — Telephone Encounter (Signed)
Called patient to review surgery benefits. Patient was unable to talk at the time of this call, as she was with her son who is in the hospital having surgery today. Patient advises she will call back. Ok to close

## 2015-11-21 NOTE — Telephone Encounter (Signed)
Per Dr. 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  She can have choice anesthesia.   Thomasene Lot attempted to call patient today to discuss benefits and family emergency is occurring. Patient stated she would call back.

## 2015-11-21 NOTE — Telephone Encounter (Signed)
Late entry for 11/20/15 at 1554: Call to patient.  She is ready to plan for outpatient surgical procedure.  Questions answered regarding timing of procedure return to work.  Patient advised will send information to business office for pre-certification and they will contact her to discuss. Patient agreeable.

## 2015-11-26 ENCOUNTER — Telehealth: Payer: Self-pay | Admitting: Obstetrics and Gynecology

## 2015-11-26 NOTE — Telephone Encounter (Signed)
Pt did not return call from last week (see previous note), called patient. We reviewed benefits for recommended procedure. She states she would like to discuss with her husband before moving forward. Jasmin Woods had advised that when I spoke with the patient, she also needed to talk with her. I advised patient after we completed our conversation, that our nurse needed to talk with her. Call was "parked", when I called Jasmin Woods to announce the call, patient hung up. Ok to close

## 2015-11-28 ENCOUNTER — Telehealth: Payer: Self-pay | Admitting: Emergency Medicine

## 2015-11-28 NOTE — Telephone Encounter (Signed)
Call to patient regarding surgical scheduling.  Messages from Dr. Oscar La given to patient regarding concerns from previously closed telephone encounters as below.  Advised patient we are aware of her family emergency with her son and patient states she cares for her Mother. She states she just doesn't have time.    Per Dr. 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  She can have choice anesthesia.    Romualdo Bolk, MD at 11/20/2015 4:56 PM     Status: Signed       Expand All Collapse All   The patient has an endometrial polyp, surgery has been delayed. Please call in one month of HRT and get her surgery on the books. Please explain to the patient that there is a small chance of endometrial cancer and I don't want to keep giving her HRT and not evaluating the polyp.       Advised patient that we are attempting to give her the best care that we can and polyp evaluation is important. Patient states she has discussed with her husband and she is ready to schedule. Discussed possible dates. Advised will review with Billie Ruddy, RN and return call.

## 2015-11-28 NOTE — Telephone Encounter (Signed)
Call back to patient. Discussed surgery date option of 12-15-15. Patient continues to be very hesitant to commit to date for surgery.  Discussed Dr Salli Quarry concern for small, but still present, risk for abnormal cells including cancer cells. Do not recommend further delay in procedure. Patient caring for her mother who is in poor health and depends on her for care. Encouraged patient to consider that she must take care of herself in order to care for her mother. Patient agreeable to proceed with surgery on 12-15-15. Surgery scheduled for 12-15-15 at 0900 at [facility].  Surgery instruction sheet reviewed and printed copy mailed to patient. Patient concerned about potential for need to reschedule if mother's health worsens. Advised to call if this occurs. Support given. Encouraged to call PRN.  Routing to provider for final review. Patient agreeable to disposition. Will close encounter.

## 2015-12-03 ENCOUNTER — Telehealth: Payer: Self-pay | Admitting: Obstetrics and Gynecology

## 2015-12-03 NOTE — Telephone Encounter (Signed)
I got a call from Dr Lazarus Salines at [facility]. He has just started the patient of 80 mg of prednisone for acute hearing loss. Tapering over 3 weeks. He recommends that she get a stress dose of steroids at the time of her surgery (hysteroscopy, d&c).

## 2015-12-04 ENCOUNTER — Encounter: Payer: Self-pay | Admitting: Obstetrics and Gynecology

## 2015-12-04 ENCOUNTER — Ambulatory Visit (INDEPENDENT_AMBULATORY_CARE_PROVIDER_SITE_OTHER): Payer: BLUE CROSS/BLUE SHIELD | Admitting: Obstetrics and Gynecology

## 2015-12-04 VITALS — BP 108/70 | HR 72 | Resp 14 | Wt 170.0 lb

## 2015-12-04 DIAGNOSIS — N840 Polyp of corpus uteri: Secondary | ICD-10-CM

## 2015-12-04 DIAGNOSIS — N950 Postmenopausal bleeding: Secondary | ICD-10-CM

## 2015-12-04 MED ORDER — PROGESTERONE MICRONIZED 100 MG PO CAPS: 100.0000 mg | ORAL_CAPSULE | Freq: Every day | ORAL | Status: DC

## 2015-12-04 MED ORDER — ESTRADIOL 0.05 MG/24HR TD PTWK: 0.0500 mg | MEDICATED_PATCH | TRANSDERMAL | Status: DC

## 2015-12-04 NOTE — Progress Notes (Signed)
Patient ID: Jasmin Woods, female   DOB: [birth year], 58 y.o.   MRN: [id number] GYNECOLOGY  VISIT   HPI: 60 y.o.   Married  Caucasian  female   [phone] with Patient's last menstrual period was 08/07/2012.   here for  Pre procedure visit. Patient will have a D&C hysteroscopy with [facility] 12/15/15.  The patient has a h/o PMP bleeding. A sonohysterogram in 11/16 was c/w endometrial polyps. Last episode of bleeding was last weekend.  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 He gave her steroids which she has decided not to take until after her surgery.   GYNECOLOGIC HISTORY: Patient's last menstrual period was 08/07/2012. Contraception: postmenopause Menopausal hormone therapy: estradiol/ progesterone         OB History    Gravida Para Term Preterm AB TAB SAB Ectopic Multiple Living   Patient Active Problem List   Diagnosis Date Noted  . Fuchs' heterochromic cyclitis of both eyes   . Fuchs' heterochromic cyclitis 06/07/2013  . Menopausal symptom 06/07/2013  . Atrophic vaginitis 06/07/2013    Past Medical History  Diagnosis Date  . History of ovarian cyst 5/02  . History of dysmenorrhea   . Plantar fasciitis 2004  . Fuchs' heterochromic cyclitis of both eyes     Past Surgical History  Procedure Laterality Date  . Other surgical history Left     Dx Scope LOA cautery   . Dilation and curettage of uterus      Current Outpatient Prescriptions  Medication Sig Dispense Refill  . Biotin 5000 MCG TABS Take 5,000 mcg by mouth daily.    . Cholecalciferol (VITAMIN D3) 5000 UNITS TABS Take 5,000 Units by mouth daily.     Marland Kitchen estradiol (CLIMARA - DOSED IN MG/24 HR) 0.05 mg/24hr patch Place 1 patch (0.05 mg total) onto the skin once a week. (Patient taking differently: Place 0.05 mg onto the skin once a week. Changes on Sundays.) 4 patch 1  . Estradiol (VAGIFEM) 10 MCG TABS vaginal tablet Place in vagina 2 x a week at bedtime 8 tablet 3  . Ginkgo Biloba  (GINKOBA PO) Take 1 tablet by mouth daily.     . progesterone (PROMETRIUM) 100 MG capsule 1 tab po qhs (Patient taking differently: Take 100 mg by mouth daily. 1 tab po qhs) 30 capsule 1  . Pseudoephedrine-APAP-DM (DAYQUIL PO) Take 1 capsule by mouth every 6 (six) hours as needed (cold symptoms).    . vitamin C (ASCORBIC ACID) 500 MG tablet Take 500 mg by mouth daily.     No current facility-administered medications for this visit.     ALLERGIES: Sulfa antibiotics and Erythromycin  Family History  Problem Relation Age of Onset  . Cancer Father 40    colon  . Stroke Father 60    stroke from afib  Father MI Mother with fuchs disease, s/p corneal transplant x 3  Social History   Social History  . Marital Status: Married    Spouse Name: N/A  . Number of Children: N/A  . Years of Education: N/A   Occupational History  . Not on file.   Social History Main Topics  . Smoking status: Never Smoker   . Smokeless tobacco: Never Used  . Alcohol Use: No  . Drug Use: No  . Sexual Activity:    Partners: Male   Other Topics Concern  .  Not on file   Social History Narrative    Review of Systems  Constitutional: Negative.   HENT: Negative.   Eyes: Negative.   Respiratory: Negative.   Cardiovascular: Negative.   Gastrointestinal: Negative.   Genitourinary: Negative.   Musculoskeletal: Negative.   Skin: Negative.   Neurological: Negative.   Endo/Heme/Allergies: Negative.   Psychiatric/Behavioral: Negative.   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  PHYSICAL EXAMINATION:    BP 108/70 mmHg  Pulse 72  Resp 14  Wt 170 lb (77.111 kg)  LMP 08/07/2012    General appearance: alert, cooperative and appears stated age Neck: no adenopathy, supple, symmetrical, trachea midline and thyroid normal to inspection and palpation Heart: regular rate and rhythm Lungs: CTAB Abdomen: soft, non-tender; bowel sounds normal; no masses,  no organomegaly Lungs: CTAB Extremities: normal, atraumatic, no  cyanosis Skin: normal color, texture and turgor, no rashes or lesions Lymph: normal cervical supraclavicular and inguinal nodes Neurologic: grossly normal    Chaperone was present for exam.  ASSESSMENT Postmenopausal bleeding, endometrial polyp on sonohysterogram in 11/16    PLAN Plan: hysteroscopy, polypectomy, dilation and curettage. Reviewed risks, including: bleeding, infection, uterine perforation, fluid overload, need for further sugery    An After Visit Summary was printed and given to the patient.

## 2015-12-04 NOTE — Telephone Encounter (Signed)
I saw the patient for her pre-op, she has decided to hold on the steroids until after her surgery.

## 2015-12-15 ENCOUNTER — Encounter (HOSPITAL_COMMUNITY): Payer: Self-pay | Admitting: Emergency Medicine

## 2015-12-15 ENCOUNTER — Ambulatory Visit (HOSPITAL_COMMUNITY): Payer: BLUE CROSS/BLUE SHIELD | Admitting: Certified Registered Nurse Anesthetist

## 2015-12-15 ENCOUNTER — Ambulatory Visit (HOSPITAL_COMMUNITY)
Admission: RE | Admit: 2015-12-15 | Discharge: 2015-12-15 | Disposition: A | Discharge status: Home / Self Care | Payer: BLUE CROSS/BLUE SHIELD | Source: Ambulatory Visit | Attending: Obstetrics and Gynecology | Admitting: Obstetrics and Gynecology

## 2015-12-15 ENCOUNTER — Encounter (HOSPITAL_COMMUNITY)
Admission: RE | Disposition: A | Discharge status: Home / Self Care | Payer: Self-pay | Source: Ambulatory Visit | Attending: Obstetrics and Gynecology

## 2015-12-15 DIAGNOSIS — N950 Postmenopausal bleeding: Secondary | ICD-10-CM | POA: Insufficient documentation

## 2015-12-15 DIAGNOSIS — N840 Polyp of corpus uteri: Secondary | ICD-10-CM | POA: Diagnosis not present

## 2015-12-15 HISTORY — DX: Family history of other specified conditions: Z84.89

## 2015-12-15 HISTORY — DX: Other specified postprocedural states: R11.2

## 2015-12-15 HISTORY — DX: Adverse effect of unspecified anesthetic, initial encounter: T41.45XA

## 2015-12-15 HISTORY — DX: Other complications of anesthesia, initial encounter: T88.59XA

## 2015-12-15 HISTORY — PX: DILATATION & CURETTAGE/HYSTEROSCOPY WITH MYOSURE: SHX6511

## 2015-12-15 HISTORY — DX: Other specified postprocedural states: Z98.890

## 2015-12-15 LAB — CBC
HEMATOCRIT: 39.1 % (ref 36.0–46.0)
HEMOGLOBIN: 13.2 g/dL (ref 12.0–15.0)
MCH: 31.4 pg (ref 26.0–34.0)
MCHC: 33.8 g/dL (ref 30.0–36.0)
MCV: 92.9 fL (ref 78.0–100.0)
Platelets: 191 10*3/uL (ref 150–400)
RBC: 4.21 MIL/uL (ref 3.87–5.11)
RDW: 12.7 % (ref 11.5–15.5)
WBC: 4.8 10*3/uL (ref 4.0–10.5)

## 2015-12-15 SURGERY — DILATATION & CURETTAGE/HYSTEROSCOPY WITH MYOSURE
Anesthesia: General

## 2015-12-15 MED ORDER — LACTATED RINGERS IV SOLN: INTRAVENOUS | Status: DC

## 2015-12-15 MED ORDER — SCOPOLAMINE 1 MG/3DAYS TD PT72
1.0000 | MEDICATED_PATCH | Freq: Once | TRANSDERMAL | Status: DC
  Administered 2015-12-15: 1.5 mg via TRANSDERMAL

## 2015-12-15 MED ORDER — FENTANYL CITRATE (PF) 100 MCG/2ML IJ SOLN: 25.0000 ug | INTRAMUSCULAR | Status: DC | PRN

## 2015-12-15 MED ORDER — MIDAZOLAM HCL 2 MG/2ML IJ SOLN
INTRAMUSCULAR | Status: DC | PRN
  Administered 2015-12-15: 2 mg via INTRAVENOUS

## 2015-12-15 MED ORDER — KETOROLAC TROMETHAMINE 30 MG/ML IJ SOLN
INTRAMUSCULAR | Status: AC
  Filled 2015-12-15: qty 1

## 2015-12-15 MED ORDER — SCOPOLAMINE 1 MG/3DAYS TD PT72
MEDICATED_PATCH | TRANSDERMAL | Status: AC
  Administered 2015-12-15: 1.5 mg via TRANSDERMAL
  Filled 2015-12-15: qty 1

## 2015-12-15 MED ORDER — LACTATED RINGERS IV SOLN
INTRAVENOUS | Status: DC
  Administered 2015-12-15 (×2): via INTRAVENOUS

## 2015-12-15 MED ORDER — PROPOFOL 10 MG/ML IV BOLUS
INTRAVENOUS | Status: DC | PRN
  Administered 2015-12-15: 200 mg via INTRAVENOUS

## 2015-12-15 MED ORDER — FENTANYL CITRATE (PF) 100 MCG/2ML IJ SOLN
INTRAMUSCULAR | Status: DC | PRN
  Administered 2015-12-15 (×2): 25 ug via INTRAVENOUS
  Administered 2015-12-15: 50 ug via INTRAVENOUS

## 2015-12-15 MED ORDER — MIDAZOLAM HCL 2 MG/2ML IJ SOLN
INTRAMUSCULAR | Status: AC
  Filled 2015-12-15: qty 2

## 2015-12-15 MED ORDER — GLYCOPYRROLATE 0.2 MG/ML IJ SOLN
INTRAMUSCULAR | Status: AC
  Filled 2015-12-15: qty 1

## 2015-12-15 MED ORDER — MEPERIDINE HCL 25 MG/ML IJ SOLN: 6.2500 mg | INTRAMUSCULAR | Status: DC | PRN

## 2015-12-15 MED ORDER — ONDANSETRON HCL 4 MG/2ML IJ SOLN
INTRAMUSCULAR | Status: AC
  Filled 2015-12-15: qty 2

## 2015-12-15 MED ORDER — KETOROLAC TROMETHAMINE 30 MG/ML IJ SOLN
INTRAMUSCULAR | Status: DC | PRN
  Administered 2015-12-15: 30 mg via INTRAVENOUS

## 2015-12-15 MED ORDER — PROMETHAZINE HCL 25 MG/ML IJ SOLN: 6.2500 mg | INTRAMUSCULAR | Status: DC | PRN

## 2015-12-15 MED ORDER — DEXAMETHASONE SODIUM PHOSPHATE 10 MG/ML IJ SOLN
INTRAMUSCULAR | Status: DC | PRN
  Administered 2015-12-15: 10 mg via INTRAVENOUS

## 2015-12-15 MED ORDER — DEXAMETHASONE SODIUM PHOSPHATE 10 MG/ML IJ SOLN
INTRAMUSCULAR | Status: AC
  Filled 2015-12-15: qty 1

## 2015-12-15 MED ORDER — ONDANSETRON HCL 4 MG/2ML IJ SOLN
INTRAMUSCULAR | Status: DC | PRN
  Administered 2015-12-15: 4 mg via INTRAVENOUS

## 2015-12-15 MED ORDER — MIDAZOLAM HCL 2 MG/2ML IJ SOLN: 0.5000 mg | Freq: Once | INTRAMUSCULAR | Status: DC | PRN

## 2015-12-15 MED ORDER — PROPOFOL 10 MG/ML IV BOLUS
INTRAVENOUS | Status: AC
  Filled 2015-12-15: qty 20

## 2015-12-15 MED ORDER — LIDOCAINE HCL (CARDIAC) 20 MG/ML IV SOLN
INTRAVENOUS | Status: AC
  Filled 2015-12-15: qty 5

## 2015-12-15 MED ORDER — LIDOCAINE HCL (CARDIAC) 20 MG/ML IV SOLN
INTRAVENOUS | Status: DC | PRN
  Administered 2015-12-15: 20 mg via INTRAVENOUS

## 2015-12-15 MED ORDER — FENTANYL CITRATE (PF) 100 MCG/2ML IJ SOLN
INTRAMUSCULAR | Status: AC
  Filled 2015-12-15: qty 2

## 2015-12-15 SURGICAL SUPPLY — 20 items
CANISTER SUCT 3000ML (MISCELLANEOUS) ×3 IMPLANT
CATH ROBINSON RED A/P 16FR (CATHETERS) ×3 IMPLANT
CLOTH BEACON ORANGE TIMEOUT ST (SAFETY) ×3 IMPLANT
CONTAINER PREFILL 10% NBF 60ML (FORM) ×4 IMPLANT
DEVICE MYOSURE LITE (MISCELLANEOUS) IMPLANT
DEVICE MYOSURE REACH (MISCELLANEOUS) ×2 IMPLANT
FILTER ARTHROSCOPY CONVERTOR (FILTER) ×3 IMPLANT
GLOVE BIO SURGEON STRL SZ 6.5 (GLOVE) ×3 IMPLANT
GLOVE BIO SURGEONS STRL SZ 6.5 (GLOVE) ×2
GLOVE BIOGEL PI IND STRL 7.0 (GLOVE) ×2 IMPLANT
GLOVE BIOGEL PI INDICATOR 7.0 (GLOVE) ×6
GOWN STRL REUS W/TWL LRG LVL3 (GOWN DISPOSABLE) ×6 IMPLANT
PACK VAGINAL MINOR WOMEN LF (CUSTOM PROCEDURE TRAY) ×3 IMPLANT
PAD OB MATERNITY 4.3X12.25 (PERSONAL CARE ITEMS) ×3 IMPLANT
SEAL ROD LENS SCOPE MYOSURE (ABLATOR) ×3 IMPLANT
SYR 20CC LL (SYRINGE) IMPLANT
TOWEL OR 17X24 6PK STRL BLUE (TOWEL DISPOSABLE) ×6 IMPLANT
TUBING AQUILEX INFLOW (TUBING) ×3 IMPLANT
TUBING AQUILEX OUTFLOW (TUBING) ×3 IMPLANT
WATER STERILE IRR 1000ML POUR (IV SOLUTION) ×3 IMPLANT

## 2015-12-15 NOTE — Transfer of Care (Signed)
Immediate Anesthesia Transfer of Care Note  Patient: Jasmin Woods  Procedure(s) Performed: Procedure(s): DILATATION & CURETTAGE/HYSTEROSCOPY WITH MYOSURE (N/A)  Patient Location: PACU  Anesthesia Type:General  Level of Consciousness: awake, alert  and oriented  Airway & Oxygen Therapy: Patient Spontanous Breathing and Patient connected to nasal cannula oxygen  Post-op Assessment: Report given to RN, Post -op Vital signs reviewed and stable and Patient moving all extremities X 4  Post vital signs: Reviewed and stable  Last Vitals:  Filed Vitals:   12/15/15 0742  BP: 113/81  Pulse: 66  Temp: 36.5 C  Resp: 16    Complications: No apparent anesthesia complications

## 2015-12-15 NOTE — Anesthesia Procedure Notes (Addendum)
Procedure Name: LMA Insertion Date/Time: 12/15/2015 9:43 AM Performed by: Harriett Rush, Damiana Berrian A Pre-anesthesia Checklist: Patient identified, Emergency Drugs available, Suction available, Timeout performed and Patient being monitored Patient Re-evaluated:Patient Re-evaluated prior to inductionOxygen Delivery Method: Circle system utilized Preoxygenation: Pre-oxygenation with 100% oxygen Intubation Type: IV induction Ventilation: Mask ventilation without difficulty LMA: LMA inserted LMA Size: 4.0 Number of attempts: 1 Tube secured with: Tape Dental Injury: Teeth and Oropharynx as per pre-operative assessment

## 2015-12-15 NOTE — Anesthesia Preprocedure Evaluation (Addendum)
Anesthesia Evaluation  Patient identified by MRN, date of birth, ID band Patient awake    Reviewed: Allergy & Precautions, NPO status , Patient's Chart, lab work & pertinent test results  History of Anesthesia Complications (+) PONV and history of anesthetic complications  Airway Mallampati: I  TM Distance: >3 FB Neck ROM: Full    Dental  (+) Dental Advisory Given, Caps Recently Bonded upper incisor: pt very concerned about harm to it:   Pulmonary neg pulmonary ROS,    breath sounds clear to auscultation       Cardiovascular negative cardio ROS   Rhythm:Regular Rate:Normal     Neuro/Psych negative neurological ROS     GI/Hepatic negative GI ROS, Neg liver ROS,   Endo/Other  negative endocrine ROS  Renal/GU negative Renal ROS     Musculoskeletal   Abdominal   Peds  Hematology negative hematology ROS (+)   Anesthesia Other Findings   Reproductive/Obstetrics                            Anesthesia Physical Anesthesia Plan  ASA: I  Anesthesia Plan: General   Post-op Pain Management:    Induction: Intravenous  Airway Management Planned: LMA  Additional Equipment:   Intra-op Plan:   Post-operative Plan:   Informed Consent: I have reviewed the patients History and Physical, chart, labs and discussed the procedure including the risks, benefits and alternatives for the proposed anesthesia with the patient or authorized representative who has indicated his/her understanding and acceptance.   Dental advisory given  Plan Discussed with: CRNA and Surgeon  Anesthesia Plan Comments: (Plan routine monitors, GA- LMA OK Pt requests dental protection)        Anesthesia Quick Evaluation

## 2015-12-15 NOTE — Interval H&P Note (Signed)
History and Physical Interval Note:  12/15/2015 9:25 AM  Jasmin Woods  has presented today for surgery, with the diagnosis of postmenopausal bleeding, endometrial polyp  The various methods of treatment have been discussed with the patient and family. After consideration of risks, benefits and other options for treatment, the patient has consented to  Procedure(s): DILATATION & CURETTAGE/HYSTEROSCOPY WITH MYOSURE (N/A) as a surgical intervention .  The patient's history has been reviewed, patient examined, no change in status, stable for surgery.  I have reviewed the patient's chart and labs.  Questions were answered to the patient's satisfaction.     Romualdo Bolk

## 2015-12-15 NOTE — Discharge Instructions (Signed)

## 2015-12-15 NOTE — H&P (View-Only) (Signed)
Patient ID: Jasmin Woods, female   DOB: 03/12/1956, 59 y.o.   MRN: [id number] GYNECOLOGY  VISIT   HPI: 59 y.o.   Married  Caucasian  female   [id number] with Patient's last menstrual period was 08/07/2012.   here for  Pre procedure visit. Patient will have a D&C hysteroscopy with myosure 12/15/15.  The patient has a h/o PMP bleeding. A sonohysterogram in 11/16 was c/w endometrial polyps. Last episode of bleeding was last weekend.  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 He gave her steroids which she has decided not to take until after her surgery.   GYNECOLOGIC HISTORY: Patient's last menstrual period was 08/07/2012. Contraception: postmenopause Menopausal hormone therapy: estradiol/ progesterone         OB History    Gravida Para Term Preterm AB TAB SAB Ectopic Multiple Living   5 3 3  2     3         Patient Active Problem List   Diagnosis Date Noted  . Fuchs' heterochromic cyclitis of both eyes   . Fuchs' heterochromic cyclitis 06/07/2013  . Menopausal symptom 06/07/2013  . Atrophic vaginitis 06/07/2013    Past Medical History  Diagnosis Date  . History of ovarian cyst 5/02  . History of dysmenorrhea   . Plantar fasciitis 2004  . Fuchs' heterochromic cyclitis of both eyes     Past Surgical History  Procedure Laterality Date  . Other surgical history Left     Dx Scope LOA cautery   . Dilation and curettage of uterus      Current Outpatient Prescriptions  Medication Sig Dispense Refill  . Biotin 5000 MCG TABS Take 5,000 mcg by mouth daily.    . Cholecalciferol (VITAMIN D3) 5000 UNITS TABS Take 5,000 Units by mouth daily.     . estradiol (CLIMARA - DOSED IN MG/24 HR) 0.05 mg/24hr patch Place 1 patch (0.05 mg total) onto the skin once a week. (Patient taking differently: Place 0.05 mg onto the skin once a week. Changes on Sundays.) 4 patch 1  . Estradiol (VAGIFEM) 10 MCG TABS vaginal tablet Place in vagina 2 x a week at bedtime 8 tablet 3  . Ginkgo Biloba  (GINKOBA PO) Take 1 tablet by mouth daily.     . progesterone (PROMETRIUM) 100 MG capsule 1 tab po qhs (Patient taking differently: Take 100 mg by mouth daily. 1 tab po qhs) 30 capsule 1  . Pseudoephedrine-APAP-DM (DAYQUIL PO) Take 1 capsule by mouth every 6 (six) hours as needed (cold symptoms).    . vitamin C (ASCORBIC ACID) 500 MG tablet Take 500 mg by mouth daily.     No current facility-administered medications for this visit.     ALLERGIES: Sulfa antibiotics and Erythromycin  Family History  Problem Relation Age of Onset  . Cancer Father 72    colon  . Stroke Father 66    stroke from afib  Father MI Mother with fuchs disease, s/p corneal transplant x 3  Social History   Social History  . Marital Status: Married    Spouse Name: N/A  . Number of Children: N/A  . Years of Education: N/A   Occupational History  . Not on file.   Social History Main Topics  . Smoking status: Never Smoker   . Smokeless tobacco: Never Used  . Alcohol Use: No  . Drug Use: No  . Sexual Activity:    Partners: Male   Other Topics Concern  .   Not on file   Social History Narrative    Review of Systems  Constitutional: Negative.   HENT: Negative.   Eyes: Negative.   Respiratory: Negative.   Cardiovascular: Negative.   Gastrointestinal: Negative.   Genitourinary: Negative.   Musculoskeletal: Negative.   Skin: Negative.   Neurological: Negative.   Endo/Heme/Allergies: Negative.   Psychiatric/Behavioral: Negative.   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  PHYSICAL EXAMINATION:    BP 108/70 mmHg  Pulse 72  Resp 14  Wt 170 lb (77.111 kg)  LMP 08/07/2012    General appearance: alert, cooperative and appears stated age Neck: no adenopathy, supple, symmetrical, trachea midline and thyroid normal to inspection and palpation Heart: regular rate and rhythm Lungs: CTAB Abdomen: soft, non-tender; bowel sounds normal; no masses,  no organomegaly Lungs: CTAB Extremities: normal, atraumatic, no  cyanosis Skin: normal color, texture and turgor, no rashes or lesions Lymph: normal cervical supraclavicular and inguinal nodes Neurologic: grossly normal    Chaperone was present for exam.  ASSESSMENT Postmenopausal bleeding, endometrial polyp on sonohysterogram in 11/16    PLAN Plan: hysteroscopy, polypectomy, dilation and curettage. Reviewed risks, including: bleeding, infection, uterine perforation, fluid overload, need for further sugery    An After Visit Summary was printed and given to the patient.

## 2015-12-15 NOTE — Anesthesia Postprocedure Evaluation (Signed)
Anesthesia Post Note  Patient: Jasmin Woods  Procedure(s) Performed: Procedure(s) (LRB): DILATATION & CURETTAGE/HYSTEROSCOPY WITH MYOSURE (N/A)  Patient location during evaluation: PACU Anesthesia Type: General Level of consciousness: awake and alert, oriented and patient cooperative Pain management: pain level controlled Vital Signs Assessment: post-procedure vital signs reviewed and stable Respiratory status: spontaneous breathing, nonlabored ventilation and respiratory function stable Cardiovascular status: blood pressure returned to baseline and stable Postop Assessment: no signs of nausea or vomiting Anesthetic complications: no    Last Vitals:  Filed Vitals:   12/15/15 1045 12/15/15 1116  BP: 101/63   Pulse: 71   Temp:  36.7 C  Resp: 12     Last Pain: There were no vitals filed for this visit.               Germaine Pomfret

## 2015-12-15 NOTE — Op Note (Signed)
Preoperative Diagnosis: postmenopausal bleeding, endometrial polyp  Postoperative Diagnosis: same  Procedure: Hysteroscopy, polypectomy, dilation and curettage  Surgeon: Dr Gertie Exon  Assistants: None  Anesthesia: General via LMA  EBL: 5 cc  Fluids: 1,200 cc LR  Fluid deficit: 20 cc  Urine output: not recorded  Indications for surgery: The patient is a 60 yo female, who presented with postmenopausal bleeding. Work up included a sonohysterogram that revealed an endometrial polyp The risks of the surgery were reviewed with the patient and the consent form was signed prior to her surgery.  Findings: EUA: normal sized anteverted uterus, no adnexal masses. With traction on the tenaculum she has uterine descensus to 1-2 cm above the introitus. Hysteroscopy: endometrial polyp, normal tubal ostia bilaterally, otherwise thin endometrium.  Specimens: endometrial polyp, endometrial curettings   Procedure: The patient was taken to the operating room with an IV in place. She was placed in the dorsal lithotomy position and anesthesia was administered. She was prepped and draped in the usual sterile fashion for a vaginal procedure. She voided on the way to the OR. A weighted speculum was placed in the vagina and a single tooth tenaculum was placed on the anterior lip of the cervix. The cervix was dilated to a #7 hagar dilator. The uterus was sounded to 8 cm. The myosure hysteroscope was inserted into the uterine cavity. With continuous infusion of normal saline, the uterine cavity was visualized with the above findings. The myosure light resectoscope was used to remove the polyp, the cavity was empty. The myosure was then removed. The cavity was then curetted with the small sharp curette. The cavity had the characteristically gritty texture at the end of the procedure. The curette and the single tooth tenaculum were removed. Oozing from the tenaculum site was stopped with pressure. The speculum was  removed. The patients perineum was cleansed of betadine and she was taken out of the dorsal lithotomy position.  Upon awakening the LMA was removed and the patient was transferred to the recovery room in stable and awake condition.  The sponge and instrument count were correct. There were no complications.

## 2015-12-16 ENCOUNTER — Encounter (HOSPITAL_COMMUNITY): Payer: Self-pay | Admitting: Obstetrics and Gynecology

## 2015-12-25 ENCOUNTER — Telehealth: Payer: Self-pay | Admitting: Obstetrics and Gynecology

## 2015-12-25 NOTE — Telephone Encounter (Signed)
Called and left patient a message to call back to reschedule her post op appointment for 12/29/15 with Dr. Oscar LaJertson. The patient had pressed the reschedule button at her automated reminder call.  Routing to Dr. Oscar LaJertson for FYI only.

## 2015-12-29 ENCOUNTER — Ambulatory Visit: Payer: BLUE CROSS/BLUE SHIELD | Admitting: Obstetrics and Gynecology

## 2015-12-29 ENCOUNTER — Encounter: Payer: Self-pay | Admitting: Obstetrics and Gynecology

## 2015-12-29 ENCOUNTER — Ambulatory Visit (INDEPENDENT_AMBULATORY_CARE_PROVIDER_SITE_OTHER): Payer: BLUE CROSS/BLUE SHIELD | Admitting: Obstetrics and Gynecology

## 2015-12-29 VITALS — BP 96/66 | HR 60 | Resp 14

## 2015-12-29 DIAGNOSIS — K5900 Constipation, unspecified: Secondary | ICD-10-CM

## 2015-12-29 DIAGNOSIS — Z9889 Other specified postprocedural states: Secondary | ICD-10-CM | POA: Diagnosis not present

## 2015-12-29 NOTE — Patient Instructions (Signed)

## 2015-12-29 NOTE — Progress Notes (Signed)
Patient ID: Jasmin Woods, female   DOB: Sep 24, 1956, 60 y.o.   MRN: [id number] GYNECOLOGY  VISIT   HPI: 60 y.o.   Married  Caucasian  female   [phone] with Patient's last menstrual period was 08/07/2012.   here for 14 day post opt - D&C hysteroscopy with myosure, polypectomy. Benign pathology. She is doing well. Her spotting stopped last week.  She continues to have issues with constipation. Only having a BM every 3-4 days. She is using a fiber laxative.   GYNECOLOGIC HISTORY: Patient's last menstrual period was 08/07/2012. Contraception: postmenopause Menopausal hormone therapy: Estradiol, Progesterone         OB History    Gravida Para Term Preterm AB TAB SAB Ectopic Multiple Living   5 3 3  2     3          Patient Active Problem List   Diagnosis Date Noted  . Fuchs' heterochromic cyclitis of both eyes   . Fuchs' heterochromic cyclitis 06/07/2013  . Menopausal symptom 06/07/2013  . Atrophic vaginitis 06/07/2013    Past Medical History  Diagnosis Date  . History of ovarian cyst 5/02  . History of dysmenorrhea   . Plantar fasciitis 2004  . Fuchs' heterochromic cyclitis of both eyes   . Complication of anesthesia   . PONV (postoperative nausea and vomiting)   . Family history of adverse reaction to anesthesia     family members had nausea/vomiting with anesthesia.    Past Surgical History  Procedure Laterality Date  . Other surgical history Left     Dx Scope LOA cautery   . Dilation and curettage of uterus    . Dilatation & curettage/hysteroscopy with myosure N/A 12/15/2015    Procedure: DILATATION & CURETTAGE/HYSTEROSCOPY WITH MYOSURE;  Surgeon: Romualdo BolkJill Evelyn Velita Quirk, MD;  Location: [facility];  Service: Gynecology;  Laterality: N/A;    Current Outpatient Prescriptions  Medication Sig Dispense Refill  . Biotin 5000 MCG TABS Take 5,000 mcg by mouth daily.    . Cholecalciferol (VITAMIN D3) 5000 UNITS TABS Take 5,000 Units by mouth daily.     Marland Kitchen. estradiol (CLIMARA - DOSED  IN MG/24 HR) 0.05 mg/24hr patch Place 1 patch (0.05 mg total) onto the skin once a week. 12 patch 1  . Estradiol (VAGIFEM) 10 MCG TABS vaginal tablet Place in vagina 2 x a week at bedtime 8 tablet 3  . Ginkgo Biloba (GINKOBA PO) Take 1 tablet by mouth daily.     . progesterone (PROMETRIUM) 100 MG capsule Take 1 capsule (100 mg total) by mouth daily. 1 tab po qhs 90 capsule 2  . vitamin C (ASCORBIC ACID) 500 MG tablet Take 500 mg by mouth daily.     No current facility-administered medications for this visit.     ALLERGIES: Sulfa antibiotics and Erythromycin  Family History  Problem Relation Age of Onset  . Cancer Father 1072    colon  . Stroke Father 3866    stroke from afib  . Heart attack Father   . Fuch's dystrophy Mother     Social History   Social History  . Marital Status: Married    Spouse Name: N/A  . Number of Children: N/A  . Years of Education: N/A   Occupational History  . Not on file.   Social History Main Topics  . Smoking status: Never Smoker   . Smokeless tobacco: Never Used  . Alcohol Use: No  . Drug Use: No  . Sexual Activity:  Partners: Male   Other Topics Concern  . Not on file   Social History Narrative    Review of Systems  Constitutional: Negative.   HENT: Negative.   Eyes: Negative.   Respiratory: Negative.   Cardiovascular: Negative.   Gastrointestinal: Negative.   Genitourinary: Negative.   Musculoskeletal: Negative.   Skin: Negative.   Neurological: Negative.   Endo/Heme/Allergies: Negative.   Psychiatric/Behavioral: Negative.     PHYSICAL EXAMINATION:    BP 96/66 mmHg  Pulse 60  Resp 14  Wt   LMP 08/07/2012    General appearance: alert, cooperative and appears stated age Abdomen: soft, non-tender; bowel sounds normal; no masses,  no organomegaly   ASSESSMENT S/P hysteroscopy, polypectomy, D&C, doing well Constipation    PLAN Routine F/U  Discussed fruit, fiber, fluids for constipation. Warned against laxatives  for long periods of time.  She can try metamucil or miralax   An After Visit Summary was printed and given to the patient.

## 2016-01-02 ENCOUNTER — Telehealth: Payer: Self-pay | Admitting: Obstetrics and Gynecology

## 2016-01-02 NOTE — Telephone Encounter (Signed)
Spoke with patient. She states that this morning, she had a bowel movement and noted vaginal bleeding. She states she is very sure that this blood came from vagina. Was like a period at first, then having spotting.  She is at [employer] shopping now and feels like she is having bleeding, but is unsure.  Feels like the will have a cycle soon and reports breast tenderness. Reports feelings of cramps "like right before I get my period."   No recent intercourse or increase in activity. 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   Patient states she is feeling emotional as well and states "I could just cry right now." She is wondering if emotional changes are related [facility] patches. Patient states she has not missed any prometrium pills or late in changing patch.   Patient declines office visit today with Dr. Edward JollySilva and declines to schedule appointment with Dr. Oscar LaJertson for follow up at this time for next week.  Return call to patient and advised reviewed with Dr. Edward JollySilva. Patient advised to call back or go to nearest emergency department or [facility] at [facility] if develops abdominal pain, fevers, or heavy bleeding. Bleeding emergency symptoms discussed and advised patient to call back or seek immediate medical care if bleeding worsens or soaking through 1 pad/tampon per hour for two hours or any other concern.  Patient verbalized understanding.    She is asked to call back on Monday and give update on her symptoms. Patient is agreeable to this and states she will call back on Monday for update.   Routing message to Dr. Edward JollySilva and Dr. Oscar LaJertson.  Will close encounter as patient states she will call back with update.

## 2016-01-02 NOTE — Telephone Encounter (Signed)
Patient called and said, "I need to speak to the nurse. I went to the bathroom this morning and had red blood just like a period. I should not be having this."  Paper chart to triage.

## 2016-04-08 ENCOUNTER — Other Ambulatory Visit: Payer: Self-pay | Admitting: Obstetrics and Gynecology

## 2016-04-08 NOTE — Telephone Encounter (Signed)
Medication refill request: YUVAFEM 10MCG Last AEX:  06/12/15 JJ; Office Visit 12/29/15 Next AEX: 06/17/16 Last MMG (if hormonal medication request): 11/18/15 [id number] Probably benign Refill authorized: 08/21/15 #8tablets w/3 refills; today please advise

## 2016-06-17 ENCOUNTER — Ambulatory Visit (INDEPENDENT_AMBULATORY_CARE_PROVIDER_SITE_OTHER): Payer: BLUE CROSS/BLUE SHIELD | Admitting: Obstetrics and Gynecology

## 2016-06-17 ENCOUNTER — Encounter: Payer: Self-pay | Admitting: Obstetrics and Gynecology

## 2016-06-17 VITALS — BP 92/60 | HR 80 | Resp 14 | Ht 69.0 in

## 2016-06-17 DIAGNOSIS — N952 Postmenopausal atrophic vaginitis: Secondary | ICD-10-CM

## 2016-06-17 DIAGNOSIS — Z01419 Encounter for gynecological examination (general) (routine) without abnormal findings: Secondary | ICD-10-CM

## 2016-06-17 DIAGNOSIS — N941 Unspecified dyspareunia: Secondary | ICD-10-CM | POA: Diagnosis not present

## 2016-06-17 DIAGNOSIS — E559 Vitamin D deficiency, unspecified: Secondary | ICD-10-CM

## 2016-06-17 DIAGNOSIS — Z0000 Encounter for general adult medical examination without abnormal findings: Secondary | ICD-10-CM | POA: Diagnosis not present

## 2016-06-17 DIAGNOSIS — Z79890 Hormone replacement therapy (postmenopausal): Secondary | ICD-10-CM

## 2016-06-17 DIAGNOSIS — Z1211 Encounter for screening for malignant neoplasm of colon: Secondary | ICD-10-CM | POA: Diagnosis not present

## 2016-06-17 LAB — COMPREHENSIVE METABOLIC PANEL
ALT: 14 U/L (ref 6–29)
AST: 25 U/L (ref 10–35)
Albumin: 4.3 g/dL (ref 3.6–5.1)
Alkaline Phosphatase: 45 U/L (ref 33–130)
BILIRUBIN TOTAL: 0.5 mg/dL (ref 0.2–1.2)
BUN: 15 mg/dL (ref 7–25)
CHLORIDE: 101 mmol/L (ref 98–110)
CO2: 27 mmol/L (ref 20–31)
CREATININE: 0.8 mg/dL (ref 0.50–1.05)
Calcium: 9.2 mg/dL (ref 8.6–10.4)
Glucose, Bld: 80 mg/dL (ref 65–99)
Potassium: 4.3 mmol/L (ref 3.5–5.3)
SODIUM: 137 mmol/L (ref 135–146)
Total Protein: 6.7 g/dL (ref 6.1–8.1)

## 2016-06-17 MED ORDER — ESTRADIOL 10 MCG VA TABS: ORAL_TABLET | VAGINAL | 3 refills | Status: DC

## 2016-06-17 MED ORDER — PROGESTERONE MICRONIZED 100 MG PO CAPS: 100.0000 mg | ORAL_CAPSULE | Freq: Every day | ORAL | 3 refills | Status: DC

## 2016-06-17 MED ORDER — ESTRADIOL 0.05 MG/24HR TD PTWK: 0.0500 mg | MEDICATED_PATCH | TRANSDERMAL | 3 refills | Status: DC

## 2016-06-17 NOTE — Patient Instructions (Signed)

## 2016-06-17 NOTE — Progress Notes (Signed)
60 y.o. [id number] MarriedCaucasianF here for annual exam.  She is s/p hysteroscopic polypectomy in the spring for postmenopausal bleeding. Benign pathology.  She is on HRT, wants to stay on it, aware of risks.    Patient's last menstrual period was 08/07/2012.          Sexually active: Yes.    The current method of family planning is post menopausal status.    Exercising: Yes.    deep water aerobics  Smoker:  no  Health Maintenance: Pap:  06-12-15 WNL NEG HR HPV History of abnormal Pap:  no MMG:  11-11-15 -abnormal- 11-18-15 DX left breast- Probably benign loosely grouped punctate and coarse calcifications within the upper-outer quadrant of the left breast, at anterior depth, for which a follow-up left breast diagnostic mammogram is recommended in 6 months to ensure stability. Colonoscopy: Never BMD:   Never TDaP:  Unsure  Gardasil: N/A    reports that she has never smoked. She has never used smokeless tobacco. She reports that she does not drink alcohol or use drugs.  Past Medical History:  Diagnosis Date  . Complication of anesthesia   . Family history of adverse reaction to anesthesia    family members had nausea/vomiting with anesthesia.  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 History of dysmenorrhea   . History of ovarian cyst 5/02  . Plantar fasciitis 2004  . PONV (postoperative nausea and vomiting)     Past Surgical History:  Procedure Laterality Date  . DILATATION & CURETTAGE/HYSTEROSCOPY WITH MYOSURE N/A 12/15/2015   Procedure: DILATATION & CURETTAGE/HYSTEROSCOPY WITH MYOSURE;  Surgeon: Romualdo Bolk, MD;  Location: [facility];  Service: Gynecology;  Laterality: N/A;  . DILATION AND CURETTAGE OF UTERUS    . OTHER SURGICAL HISTORY Left    Dx Scope LOA cautery     Current Outpatient Prescriptions  Medication Sig Dispense Refill  . estradiol (CLIMARA - DOSED IN MG/24 HR) 0.05 mg/24hr patch Place 1 patch (0.05 mg total) onto the skin once a week. 12 patch 3  .  Estradiol (YUVAFEM) 10 MCG TABS vaginal tablet USE VAGINALLY TWICE WEEKLY, AT BEDTIME 24 tablet 3  . Ginkgo Biloba (GINKOBA PO) Take 1 tablet by mouth daily.     . progesterone (PROMETRIUM) 100 MG capsule Take 1 capsule (100 mg total) by mouth daily. 1 tab po qhs 90 capsule 3   No current facility-administered medications for this visit.     Family History  Problem Relation Age of Onset  . Cancer Father 63    colon  . Stroke Father 70    stroke from afib  . Heart attack Father   . Fuch's dystrophy Mother     Review of Systems  Constitutional: Negative.   HENT: Negative.   Eyes: Negative.   Respiratory: Negative.   Cardiovascular: Negative.   Gastrointestinal: Negative.   Endocrine: Negative.   Genitourinary: Negative.   Musculoskeletal: Negative.   Skin: Negative.   Allergic/Immunologic: Negative.   Neurological: Negative.   Psychiatric/Behavioral: Negative.   some issues with constipation.  Exam:   BP 92/60 (BP Location: Right Arm, Patient Position: Sitting, Cuff Size: Normal)   Pulse 80   Resp 14   Ht 5\' 9"  (1.753 m)   LMP 08/07/2012   Weight change: @WEIGHTCHANGE @ Height:   Height: 5\' 9"  (175.3 cm)  Ht Readings from Last 3 Encounters:  06/17/16 5\' 9"  (1.753 m)  12/15/15 5\' 9"  (1.753 m)  06/12/15 5' 9.25" (1.759 m)  General appearance: alert, cooperative and appears stated age Head: Normocephalic, without obvious abnormality, atraumatic Neck: no adenopathy, supple, symmetrical, trachea midline and thyroid normal to inspection and palpation Lungs: clear to auscultation bilaterally Breasts: normal appearance, no masses or tenderness Heart: regular rate and rhythm Abdomen: soft, non-tender; bowel sounds normal; no masses,  no organomegaly Extremities: extremities normal, atraumatic, no cyanosis or edema Skin: Skin color, texture, turgor normal. No rashes or lesions Lymph nodes: Cervical, supraclavicular, and axillary nodes normal. No abnormal inguinal nodes  palpated Neurologic: Grossly normal   Pelvic: External genitalia:  no lesions              Urethra:  normal appearing urethra with no masses, tenderness or lesions              Bartholins and Skenes: normal                 Vagina: normal appearing vagina with normal color and discharge, no lesions              Cervix: no lesions               Bimanual Exam:  Uterus:  normal size, contour, position, consistency, mobility, non-tender              Adnexa: no mass, fullness, tenderness               Rectovaginal: Confirms               Anus:  normal sphincter tone, no lesions  Chaperone was present for exam.  A:  Well Woman with normal exam  Mild dyspareunia, using the vaginal estrogen 1 x week, will increase, use lubricant, she will  control rate and depth of pentration  P:   Discussed the importance of mammograms, aware that HRT increases the risk of breast cancer  Normal lipids last year  Continue HRT, aware of risks, including the risk of breast cancer  IFOB test given, she declines colonosocpy  CMP, vit D

## 2016-06-18 LAB — VITAMIN D 25 HYDROXY (VIT D DEFICIENCY, FRACTURES): Vit D, 25-Hydroxy: 28 ng/mL — ABNORMAL LOW (ref 30–100)

## 2016-06-22 ENCOUNTER — Telehealth: Payer: Self-pay | Admitting: *Deleted

## 2016-06-22 LAB — HEMOGLOBIN, FINGERSTICK: Hemoglobin, fingerstick: 13.7 g/dL (ref 12.0–16.0)

## 2016-06-22 NOTE — Telephone Encounter (Signed)
Spoke with patient and she is aware of results -eh 

## 2016-06-22 NOTE — Telephone Encounter (Signed)
Spoke with patient regarding her need for 6 month follow up left breast DX mammogram at her office visit. Patient states that she does not feel it is neccessary as she always has the same abnormal results for that last several years now. She is refusing to follow up until her 1 year is due. Please advise on recall status Thanks Consuella Loselaine

## 2016-06-22 NOTE — Telephone Encounter (Signed)
-----   Message from Romualdo BolkJill Evelyn Jertson, MD sent at 06/22/2016  9:20 AM EDT ----- Please advise the patient of normal results.

## 2016-06-22 NOTE — Telephone Encounter (Signed)
Left message to call regarding all lab results -eh

## 2016-06-25 ENCOUNTER — Other Ambulatory Visit: Payer: Self-pay

## 2016-06-25 MED ORDER — ESTRADIOL 0.05 MG/24HR TD PTWK: 0.0500 mg | MEDICATED_PATCH | TRANSDERMAL | 3 refills | Status: DC

## 2016-06-25 MED ORDER — PROGESTERONE MICRONIZED 100 MG PO CAPS: 100.0000 mg | ORAL_CAPSULE | Freq: Every day | ORAL | 3 refills | Status: DC

## 2016-06-25 NOTE — Telephone Encounter (Signed)
Medication refill request: Climara Last AEX:  06/17/16 JJ Next AEX: 06/22/17 JJ Last MMG (if hormonal medication request): 11/10/15 BIRADS0; 11/18/15 3D Dx L Breast-BIRADS3-f/u L Breast dx mammo in 6 months. Refill authorized: 06/17/16 #12 Patch 3R. Please advise. Thank you.   Medication refill request: Progesterone Last AEX:  06/17/16 JJ Next AEX: 06/22/17 JJ Last MMG (if hormonal medication request): 11/10/15 BIRADS0; 11/18/15 3D Dx L Breast-BIRADS3-f/u L Breast dx mammo in 6 months. Refill authorized: 06/17/16. #90 3R. Please advise. Thank you.   --[facility] be sent there because the patient has changed pharmacies. I show the prescriptions written on 06/17/16 were printed. I do not see a copy or note stating they were faxed. Thanks.

## 2016-07-05 NOTE — Telephone Encounter (Signed)
Dr. Hyacinth MeekerMiller please review message below. Thanks Consuella LoseElaine

## 2016-07-07 NOTE — Telephone Encounter (Signed)
Pt is clearly aware of the recommendation and declines.  Ok to remove from recall.

## 2016-07-07 NOTE — Telephone Encounter (Signed)
Patient removed from recall -eh  

## 2016-07-08 ENCOUNTER — Other Ambulatory Visit: Payer: Self-pay | Admitting: Orthopedic Surgery

## 2016-07-10 ENCOUNTER — Other Ambulatory Visit: Payer: Self-pay | Admitting: Orthopedic Surgery

## 2016-07-10 DIAGNOSIS — R9402 Abnormal brain scan: Secondary | ICD-10-CM

## 2016-07-23 ENCOUNTER — Ambulatory Visit
Admission: RE | Admit: 2016-07-23 | Discharge: 2016-07-23 | Disposition: A | Discharge status: Home / Self Care | Payer: BLUE CROSS/BLUE SHIELD | Source: Ambulatory Visit | Attending: Orthopedic Surgery | Admitting: Orthopedic Surgery

## 2016-07-23 DIAGNOSIS — R9402 Abnormal brain scan: Secondary | ICD-10-CM

## 2016-07-23 MED ORDER — GADOBENATE DIMEGLUMINE 529 MG/ML IV SOLN
8.0000 mL | Freq: Once | INTRAVENOUS | Status: AC | PRN
  Administered 2016-07-23: 8 mL via INTRAVENOUS

## 2017-05-05 ENCOUNTER — Other Ambulatory Visit: Payer: Self-pay | Admitting: Obstetrics and Gynecology

## 2017-05-05 NOTE — Telephone Encounter (Signed)
[facility] Last filled - 06/25/16, #90 X 3 RF Last AEX - 06/17/16 JJ Next AEX - 06/22/17 JJ Last MMG (if hormonal request) - 11/10/15 with ultrasound on 11/18/15, Bi-Rads 3: Probably benign, recommend follow up 6 months  Phone call to [facility] to see if patient is truly out of refills. Per Boneta LucksJenny, patient received:  #90 on 06/25/16 #30 on 09/18/16 #90 on 10/15/16 and  #90 on 01/26/17  Patient has 60 remaining tablets on this prescription. Boneta LucksJenny will fill for patient to last until next annual exam with Dr. Oscar LaJertson.  RX request denied. Routing to provider for final review. Closing encounter.

## 2017-06-22 ENCOUNTER — Ambulatory Visit: Payer: BLUE CROSS/BLUE SHIELD | Admitting: Obstetrics and Gynecology

## 2017-07-14 ENCOUNTER — Telehealth: Payer: Self-pay | Admitting: Obstetrics and Gynecology

## 2017-07-14 ENCOUNTER — Other Ambulatory Visit: Payer: Self-pay | Admitting: Obstetrics and Gynecology

## 2017-07-14 MED ORDER — PROGESTERONE MICRONIZED 100 MG PO CAPS: 100.0000 mg | ORAL_CAPSULE | Freq: Every day | ORAL | 0 refills | Status: DC

## 2017-07-14 NOTE — Telephone Encounter (Signed)
Spoke with patient. Patient requesting refill of Prometrium, has 4 pills left. No refills needed for estradiol patch.   Pharmacy [facility]  Reviewed MMG with patient, last MMG 11/18/15, no f/u since.  Advised patient would need updated MMG prior to refills. Patient request to be reviewed with Dr. Oscar La and return call, patient verbalizes understanding and is agreeable.   Last AEX 06/17/16 Next AEX 08/08/17  Dr. Oscar La -please advise on refill?

## 2017-07-14 NOTE — Telephone Encounter (Signed)
Patient says her refill request was denied.  States she has appointment  With Dr Oscar La on 08/08/17 and will be out of medication before then.

## 2017-07-14 NOTE — Telephone Encounter (Signed)
I've sent in a refill for her. She must be on prometrium is she is on the estrogen. I agree that she needs a mammogram. Please inform, and close the encounter.

## 2017-07-14 NOTE — Telephone Encounter (Signed)
Left message to call Noreene Larsson at [phone].  Last MMG per review of EPIC 11/18/15 at [facility], recommended f/u left breast diagnostic mammogram, with magnification views, in 6 months.

## 2017-07-14 NOTE — Telephone Encounter (Signed)
Patient called after hours and left a message requesting a call back from the nurse.

## 2017-07-15 ENCOUNTER — Other Ambulatory Visit: Payer: Self-pay | Admitting: Obstetrics and Gynecology

## 2017-07-15 DIAGNOSIS — R921 Mammographic calcification found on diagnostic imaging of breast: Secondary | ICD-10-CM

## 2017-07-15 DIAGNOSIS — N6489 Other specified disorders of breast: Secondary | ICD-10-CM

## 2017-07-15 MED ORDER — PROGESTERONE MICRONIZED 100 MG PO CAPS: 100.0000 mg | ORAL_CAPSULE | Freq: Every day | ORAL | 0 refills | Status: DC

## 2017-07-15 NOTE — Addendum Note (Signed)
Addended by: Loreta Ave on: 07/15/2017 09:19 AM   Modules accepted: Orders

## 2017-07-15 NOTE — Telephone Encounter (Signed)
Rx was resent again today as prescribed by JJ.  First Rx was printed and Lennon Alstrom is out of the office to hand sign.

## 2017-07-15 NOTE — Telephone Encounter (Signed)
Spoke with patient, advised as seen below per Dr. Oscar La. Patient states she is still using 1/2 estradiol patch weekly, does not need refills at this time.  Offered assistance with scheduling MMG at [facility], patient declined, patient will call directly to schedule.   Routing to provider for final review. Patient is agreeable to disposition. Will close encounter.

## 2017-07-18 ENCOUNTER — Telehealth: Payer: Self-pay

## 2017-07-18 NOTE — Telephone Encounter (Signed)
Telephone call passed from Jasmin Woods.Patient states that [facility] to [facility]. Was advised she will need to have a bilateral diagnostic mammogram and bilateral screening mammogram. States this will be too costly. Patients last mammogram was 11/10/2015. Has follow up diagnostic on 11/18/2015 for left breast mass. Was to follow up in 6 months for left diagnostic mammogram. Patient declined (see note dated 06/22/2016). Advised patient since she is due to a left diagnostic the [facility] will recommend she have bilateral diagnostic imaging performed. Patient states this mass has been present since she was 61 years old. Patient wants screening mammogram only. Advised the [facility] will likely require bilateral diagnostic imaging. Advised to speak with the [facility]. Patient requests I speak with Dr.Jertson regarding recommendations.

## 2017-07-19 NOTE — Telephone Encounter (Addendum)
Call to [facility]. Left message on Larita Fife, Nurse Breast Health Navigator, voice mail for assistance for this patient in scheduling.

## 2017-07-19 NOTE — Telephone Encounter (Signed)
Spoke with Cherish at the [facility]. Per Cherish the patient will need diagnostic follow up of left breast. States insurance will not cover imaging with diagnostic left and screening on the right breast. Patient will need bilateral imaging per Cherish. Unclear if this is a new lump as the [facility] does not have prior records on the patient. Per notes in system the patient's last imaging was 10 years ago and she is unsure where.  Call to [facility] no record of imaging at their location.

## 2017-07-19 NOTE — Telephone Encounter (Signed)
Call back from Nelagoney at [facility]. She states center director has contacted patient personally and issues are resolve and patient is scheduled for appointment this Friday 07-22-17.   Routing to provider for final review. Patient agreeable to disposition. Will close encounter.

## 2017-07-19 NOTE — Telephone Encounter (Signed)
My understanding from reading the mammogram report from 1/18 is that they found some new calcifications and recommended a f/u diagnostic left breast mammogram in 6 months. I don't understand why she can't have a screening mammogram on the right. Please check with radiology I understand her frustration with the cost. Unfortunately the screening mammogram isn't as in depth as the diagnostic mammogram. I do think she should do this, particularly because she is on HRT.

## 2017-07-26 ENCOUNTER — Ambulatory Visit
Admission: RE | Admit: 2017-07-26 | Discharge: 2017-07-26 | Disposition: A | Discharge status: Home / Self Care | Payer: No Typology Code available for payment source | Source: Ambulatory Visit | Attending: Obstetrics and Gynecology | Admitting: Obstetrics and Gynecology

## 2017-07-26 ENCOUNTER — Other Ambulatory Visit: Payer: Self-pay | Admitting: Obstetrics and Gynecology

## 2017-07-26 DIAGNOSIS — N6489 Other specified disorders of breast: Secondary | ICD-10-CM

## 2017-07-26 DIAGNOSIS — R921 Mammographic calcification found on diagnostic imaging of breast: Secondary | ICD-10-CM

## 2017-08-08 ENCOUNTER — Ambulatory Visit (INDEPENDENT_AMBULATORY_CARE_PROVIDER_SITE_OTHER): Payer: No Typology Code available for payment source | Admitting: Obstetrics and Gynecology

## 2017-08-08 ENCOUNTER — Encounter: Payer: Self-pay | Admitting: Obstetrics and Gynecology

## 2017-08-08 VITALS — BP 104/60 | HR 88 | Resp 16 | Ht 69.0 in | Wt 168.0 lb

## 2017-08-08 DIAGNOSIS — R5383 Other fatigue: Secondary | ICD-10-CM

## 2017-08-08 DIAGNOSIS — Z0000 Encounter for general adult medical examination without abnormal findings: Secondary | ICD-10-CM | POA: Diagnosis not present

## 2017-08-08 DIAGNOSIS — Z23 Encounter for immunization: Secondary | ICD-10-CM

## 2017-08-08 DIAGNOSIS — Z79890 Hormone replacement therapy (postmenopausal): Secondary | ICD-10-CM

## 2017-08-08 DIAGNOSIS — L853 Xerosis cutis: Secondary | ICD-10-CM

## 2017-08-08 DIAGNOSIS — Z01419 Encounter for gynecological examination (general) (routine) without abnormal findings: Secondary | ICD-10-CM

## 2017-08-08 DIAGNOSIS — E559 Vitamin D deficiency, unspecified: Secondary | ICD-10-CM

## 2017-08-08 MED ORDER — ESTRADIOL 10 MCG VA TABS: ORAL_TABLET | VAGINAL | 3 refills | Status: DC

## 2017-08-08 MED ORDER — ESTRADIOL 0.05 MG/24HR TD PTWK: 0.0500 mg | MEDICATED_PATCH | TRANSDERMAL | 3 refills | Status: DC

## 2017-08-08 MED ORDER — PROGESTERONE MICRONIZED 100 MG PO CAPS: 100.0000 mg | ORAL_CAPSULE | Freq: Every day | ORAL | 3 refills | Status: DC

## 2017-08-08 NOTE — Progress Notes (Signed)
61 y.o. [id number] MarriedCaucasianF here for annual exam.  She is on HRT. She is using 3/4 of a patch at a time (0.05 mg) and prometrium daily. Vaginal estrogen when she remembers. No longer having dyspareunia. No bleeding in the last year.  She c/o dry skin and fatigue.    Patient's last menstrual period was 08/07/2012.          Sexually active: Yes.    The current method of family planning is post menopausal status.    Exercising: Yes.    deep water aerobics  Smoker:  no  Health Maintenance: Pap:  06-12-15 WNL NEG HR HPV  History of abnormal Pap: yes years ago  MMG:  07-26-17 Stable probably benign left breast calcifications.No mammographic evidence of malignancy in the right breast- DX left breast MM in 6 month  Colonoscopy:  Never BMD:   Never TDaP:  Unsure, last listed in the computer is 2004 Gardasil: N/A   reports that she has never smoked. She has never used smokeless tobacco. She reports that she does not drink alcohol or use drugs.  Past Medical History:  Diagnosis Date  . Complication of anesthesia   . Family history of adverse reaction to anesthesia    family members had nausea/vomiting with anesthesia.  Marice Potter. Fuchs' heterochromic cyclitis of both eyes   . History of dysmenorrhea   . History of ovarian cyst 5/02  . Plantar fasciitis 2004  . PONV (postoperative nausea and vomiting)     Past Surgical History:  Procedure Laterality Date  . DILATATION & CURETTAGE/HYSTEROSCOPY WITH MYOSURE N/A 12/15/2015   Procedure: DILATATION & CURETTAGE/HYSTEROSCOPY WITH MYOSURE;  Surgeon: Romualdo BolkJill Evelyn Jostin Rue, MD;  Location: [facility];  Service: Gynecology;  Laterality: N/A;  . DILATION AND CURETTAGE OF UTERUS    . OTHER SURGICAL HISTORY Left    Dx Scope LOA cautery     Current Outpatient Prescriptions  Medication Sig Dispense Refill  . estradiol (CLIMARA - DOSED IN MG/24 HR) 0.05 mg/24hr patch Place 1 patch (0.05 mg total) onto the skin once a week. 12 patch 3  . Estradiol (YUVAFEM) 10  MCG TABS vaginal tablet USE VAGINALLY TWICE WEEKLY, AT BEDTIME 24 tablet 3  . progesterone (PROMETRIUM) 100 MG capsule Take 1 capsule (100 mg total) by mouth daily. 1 tab po qhs 90 capsule 0   No current facility-administered medications for this visit.     Family History  Problem Relation Age of Onset  . Cancer Father 4772       colon  . Stroke Father [id number]       stroke from afib  . Heart attack Father   . Fuch's dystrophy Mother     Review of Systems  Constitutional: Negative.   HENT: Negative.   Eyes: Negative.   Respiratory: Negative.   Cardiovascular: Negative.   Gastrointestinal: Negative.   Endocrine: Negative.   Genitourinary: Negative.   Musculoskeletal: Negative.   Skin: Negative.   Allergic/Immunologic: Negative.   Neurological: Negative.   Psychiatric/Behavioral: Negative.     Exam:   BP 104/60 (BP Location: Right Arm, Patient Position: Sitting, Cuff Size: Normal)   Pulse 88   Resp 16   Ht 5\' 9"  (1.753 m)   Wt 168 lb (76.2 kg)   LMP 08/07/2012   BMI 24.81 kg/m   Weight change: @WEIGHTCHANGE @ Height:   Height: 5\' 9"  (175.3 cm)  Ht Readings from Last 3 Encounters:  08/08/17 5\' 9"  (1.753 m)  06/17/16 5\' 9"  (1.753 m)  12/15/15 5'  9" (1.753 m)    General appearance: alert, cooperative and appears stated age Head: Normocephalic, without obvious abnormality, atraumatic Neck: no adenopathy, supple, symmetrical, trachea midline and thyroid normal to inspection and palpation Lungs: clear to auscultation bilaterally Cardiovascular: regular rate and rhythm Breasts: normal appearance, no masses or tenderness Abdomen: soft, non-tender; non distended,  no masses,  no organomegaly Extremities: extremities normal, atraumatic, no cyanosis or edema Skin: Skin color, texture, turgor normal. No rashes or lesions Lymph nodes: Cervical, supraclavicular, and axillary nodes normal. No abnormal inguinal nodes palpated Neurologic: Grossly normal   Pelvic: External genitalia:   no lesions              Urethra:  normal appearing urethra with no masses, tenderness or lesions              Bartholins and Skenes: normal                 Vagina: normal appearing vagina with normal color and discharge, no lesions              Cervix: no lesions               Bimanual Exam:  Uterus:  normal size, contour, position, consistency, mobility, non-tender              Adnexa: no mass, fullness, tenderness               Rectovaginal: Confirms               Anus:  normal sphincter tone, no lesions  Chaperone was present for exam.  A:  Well Woman with normal exam  HRT  Fatigue, dry skin    P:   She has been needing repeat diagnostic breast imaging every year. She is very frustrated, doesn't think anything has changed, states they are always looking at the same region.   Will reach out to the radiologist  Will continue HRT, she is aware of the risks, including: blood clots, stroke, heart attack and breast cancer  Discussed breast self exam  Discussed calcium and vit D intake.   Screening labs, TSH  TDAP

## 2017-08-08 NOTE — Patient Instructions (Signed)

## 2017-08-09 ENCOUNTER — Telehealth: Payer: Self-pay | Admitting: Obstetrics and Gynecology

## 2017-08-09 LAB — COMPREHENSIVE METABOLIC PANEL
A/G RATIO: 2.5 — AB (ref 1.2–2.2)
ALK PHOS: 50 IU/L (ref 39–117)
ALT: 12 IU/L (ref 0–32)
AST: 22 IU/L (ref 0–40)
Albumin: 4.5 g/dL (ref 3.5–5.5)
BILIRUBIN TOTAL: 0.3 mg/dL (ref 0.0–1.2)
BUN/Creatinine Ratio: 16 (ref 9–23)
BUN: 12 mg/dL (ref 6–24)
CO2: 25 mmol/L (ref 20–29)
Calcium: 8.6 mg/dL — ABNORMAL LOW (ref 8.7–10.2)
Chloride: 103 mmol/L (ref 96–106)
Creatinine, Ser: 0.73 mg/dL (ref 0.57–1.00)
GFR calc Af Amer: 104 mL/min/{1.73_m2} (ref >59)
GFR, EST NON AFRICAN AMERICAN: 90 mL/min/{1.73_m2} (ref >59)
GLOBULIN, TOTAL: 1.8 g/dL (ref 1.5–4.5)
Glucose: 85 mg/dL (ref 65–99)
POTASSIUM: 4.3 mmol/L (ref 3.5–5.2)
SODIUM: 142 mmol/L (ref 134–144)
Total Protein: 6.3 g/dL (ref 6.0–8.5)

## 2017-08-09 LAB — LIPID PANEL
CHOL/HDL RATIO: 4 ratio (ref 0.0–4.4)
Cholesterol, Total: 238 mg/dL — ABNORMAL HIGH (ref 100–199)
HDL: 59 mg/dL (ref >39)
LDL Calculated: 126 mg/dL — ABNORMAL HIGH (ref 0–99)
TRIGLYCERIDES: 265 mg/dL — AB (ref 0–149)
VLDL Cholesterol Cal: 53 mg/dL — ABNORMAL HIGH (ref 5–40)

## 2017-08-09 LAB — CBC
HEMATOCRIT: 38.9 % (ref 34.0–46.6)
Hemoglobin: 13.5 g/dL (ref 11.1–15.9)
MCH: 32.1 pg (ref 26.6–33.0)
MCHC: 34.7 g/dL (ref 31.5–35.7)
MCV: 93 fL (ref 79–97)
PLATELETS: 205 10*3/uL (ref 150–379)
RBC: 4.2 x10E6/uL (ref 3.77–5.28)
RDW: 13.5 % (ref 12.3–15.4)
WBC: 5.7 10*3/uL (ref 3.4–10.8)

## 2017-08-09 LAB — TSH: TSH: 1.39 u[IU]/mL (ref 0.450–4.500)

## 2017-08-09 LAB — VITAMIN D 25 HYDROXY (VIT D DEFICIENCY, FRACTURES): VIT D 25 HYDROXY: 26.7 ng/mL — AB (ref 30.0–100.0)

## 2017-08-09 NOTE — Telephone Encounter (Signed)
Sarah from [facility] called requesting to speak with Consuella Loselaine about this patient. She said to tell Consuella Loselaine, "We follow calcifications for two years in the same spot. This patient will need one more follow up."

## 2017-08-11 NOTE — Telephone Encounter (Signed)
Spoke with patient and gave message regarding the breast imaging recommendations. Patient voiced understanding -eh

## 2017-08-11 NOTE — Telephone Encounter (Signed)
Jasmin Woods, please let the patient know the recommendations from the breast center.

## 2017-08-18 ENCOUNTER — Telehealth: Payer: Self-pay | Admitting: *Deleted

## 2017-08-18 NOTE — Telephone Encounter (Signed)
-----   Message from Romualdo BolkJill Evelyn Jertson, MD sent at 08/17/2017  6:18 PM EDT ----- Corrected calcium level is 9.8 (normal).  Please let the patient know that her lipid panel was abnormal and her vit d level was low.  She should return and have a fasting lipid panel, she can do this here or with her primary (if it is still abnormal I would send her to her primary) I would recommend that she increase her current vit d intake by 1,000 IU a day long term.

## 2017-08-18 NOTE — Telephone Encounter (Signed)
Left message to call regarding results -eh 

## 2017-08-18 NOTE — Telephone Encounter (Signed)
Spoke with patient and gave results recommendations. Patient advised to follow up with PCP for elevated lipids -eh

## 2018-03-21 ENCOUNTER — Telehealth: Payer: Self-pay | Admitting: *Deleted

## 2018-03-21 NOTE — Telephone Encounter (Signed)
Spoke with patient regarding 04 recall. Patient is need follow up DX MMG. Patient is declining to do this. She feels this is not necessary. Please advise on recall status Thanks

## 2018-03-23 NOTE — Telephone Encounter (Signed)
Please send a letter, with the recommendations of f/u imaging including the potentially increased risk of breast cancer on HRT. Then close the encounter.

## 2018-03-27 ENCOUNTER — Encounter: Payer: Self-pay | Admitting: *Deleted

## 2018-03-27 NOTE — Telephone Encounter (Signed)
Letter sent- removed from recall

## 2018-08-01 ENCOUNTER — Telehealth: Payer: Self-pay | Admitting: *Deleted

## 2018-08-01 NOTE — Telephone Encounter (Signed)
Spoke with patient. Patient states that her mother had a stroke and she is having to be at her home 80 hours a week. Patient's son is getting married this weekend. Unable to call and schedule an appointment at this time. Aware of the importance. Declines assistance with scheduling. States she will call to schedule.  Routing to Dr.Jertson for review.

## 2018-08-01 NOTE — Telephone Encounter (Signed)
Left message to call Kaitlyn at [phone]. 

## 2018-08-01 NOTE — Telephone Encounter (Signed)
Patient in for 04 recall since 02/14/18 Needs Left Diagnostic MMG scheduled.

## 2018-08-02 NOTE — Telephone Encounter (Signed)
Recall extended by 2 months.

## 2018-08-02 NOTE — Telephone Encounter (Signed)
Please keep her in recall, call again in 2 months if not done. I will discuss it with her at her annual exam later this month.

## 2018-08-14 ENCOUNTER — Ambulatory Visit: Payer: No Typology Code available for payment source | Admitting: Obstetrics and Gynecology

## 2018-08-14 ENCOUNTER — Other Ambulatory Visit: Payer: Self-pay

## 2018-08-14 ENCOUNTER — Encounter: Payer: Self-pay | Admitting: Obstetrics and Gynecology

## 2018-08-14 ENCOUNTER — Other Ambulatory Visit (HOSPITAL_COMMUNITY)
Admission: RE | Admit: 2018-08-14 | Discharge: 2018-08-14 | Disposition: A | Discharge status: Home / Self Care | Payer: No Typology Code available for payment source | Source: Ambulatory Visit | Attending: Obstetrics and Gynecology | Admitting: Obstetrics and Gynecology

## 2018-08-14 VITALS — BP 118/68 | HR 64 | Ht 69.0 in | Wt 158.8 lb

## 2018-08-14 DIAGNOSIS — Z1151 Encounter for screening for human papillomavirus (HPV): Secondary | ICD-10-CM | POA: Insufficient documentation

## 2018-08-14 DIAGNOSIS — Z0000 Encounter for general adult medical examination without abnormal findings: Secondary | ICD-10-CM

## 2018-08-14 DIAGNOSIS — Z124 Encounter for screening for malignant neoplasm of cervix: Secondary | ICD-10-CM | POA: Diagnosis not present

## 2018-08-14 DIAGNOSIS — Z79890 Hormone replacement therapy (postmenopausal): Secondary | ICD-10-CM | POA: Diagnosis not present

## 2018-08-14 DIAGNOSIS — Z01419 Encounter for gynecological examination (general) (routine) without abnormal findings: Secondary | ICD-10-CM | POA: Diagnosis not present

## 2018-08-14 DIAGNOSIS — E559 Vitamin D deficiency, unspecified: Secondary | ICD-10-CM | POA: Diagnosis not present

## 2018-08-14 NOTE — Progress Notes (Signed)
62 y.o. [id number] Married White or Caucasian Not Hispanic or Latino female here for annual exam.  Mom has had 2 strokes, she stays with her Mother at her house 80 hours a week, has home health. Can't place her in a home because she is combative. Workers keep quitting.  The patient is under constant stress. She is still working at [employer] in the evenings.  Son got married last weekend.  No vaginal bleeding, no dyspareunia.     Patient's last menstrual period was 08/07/2012.          Sexually active: Yes.    The current method of family planning is post menopausal status.    Exercising: Yes.    water aerobics Smoker:  no  Health Maintenance: Pap:  06-12-15 WNL NEG HR HPV  History of abnormal Pap: yes years ago  MMG:  07-26-17 Stable probably benign left breast calcifications.No mammographic evidence of malignancy in the right breast- DX left breast MMG due now  Colonoscopy:  Never BMD:   Never TDaP:  08-08-2017 Gardasil: N/A   reports that she has never smoked. She has never used smokeless tobacco. She reports that she does not drink alcohol or use drugs.  Past Medical History:  Diagnosis Date  . Complication of anesthesia   . Family history of adverse reaction to anesthesia    family members had nausea/vomiting with anesthesia.  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 History of dysmenorrhea   . History of ovarian cyst 5/02  . Plantar fasciitis 2004  . PONV (postoperative nausea and vomiting)     Past Surgical History:  Procedure Laterality Date  . DILATATION & CURETTAGE/HYSTEROSCOPY WITH MYOSURE N/A 12/15/2015   Procedure: DILATATION & CURETTAGE/HYSTEROSCOPY WITH MYOSURE;  Surgeon: Romualdo Bolk, MD;  Location: [facility];  Service: Gynecology;  Laterality: N/A;  . DILATION AND CURETTAGE OF UTERUS    . OTHER SURGICAL HISTORY Left    Dx Scope LOA cautery     Current Outpatient Medications  Medication Sig Dispense Refill  . estradiol (CLIMARA - DOSED IN MG/24 HR) 0.05  mg/24hr patch Place 1 patch (0.05 mg total) onto the skin once a week. 12 patch 3  . progesterone (PROMETRIUM) 100 MG capsule Take 1 capsule (100 mg total) by mouth daily. 1 tab po qhs 90 capsule 3  . Estradiol (YUVAFEM) 10 MCG TABS vaginal tablet USE VAGINALLY TWICE WEEKLY, AT BEDTIME (Patient not taking: Reported on 08/14/2018) 24 tablet 3   No current facility-administered medications for this visit.     Family History  Problem Relation Age of Onset  . Cancer Father 19       colon  . Stroke Father 42       stroke from afib  . Heart attack Father   . Fuch's dystrophy Mother     Review of Systems  Constitutional: Negative.   HENT: Negative.   Eyes: Negative.   Respiratory: Negative.   Cardiovascular: Negative.   Gastrointestinal: Negative.   Endocrine: Negative.   Genitourinary: Negative.   Musculoskeletal: Negative.   Skin: Negative.   Allergic/Immunologic: Negative.   Neurological: Negative.   Hematological: Negative.   Psychiatric/Behavioral: Negative.     Exam:   BP 118/68 (BP Location: Right Arm, Patient Position: Sitting, Cuff Size: Normal)   Pulse 64   Ht 5\' 9"  (1.753 m)   Wt 158 lb 12.8 oz (72 kg)   LMP 08/07/2012   BMI 23.45 kg/m   Weight change: @WEIGHTCHANGE @ Height:  Height: 5\' 9"  (175.3 cm)  Ht Readings from Last 3 Encounters:  08/14/18 5\' 9"  (1.753 m)  08/08/17 5\' 9"  (1.753 m)  06/17/16 5\' 9"  (1.753 m)    General appearance: alert, cooperative and appears stated age Head: Normocephalic, without obvious abnormality, atraumatic Neck: no adenopathy, supple, symmetrical, trachea midline and thyroid normal to inspection and palpation Lungs: clear to auscultation bilaterally Cardiovascular: regular rate and rhythm Breasts: normal appearance, no masses or tenderness Abdomen: soft, non-tender; non distended,  no masses,  no organomegaly Extremities: extremities normal, atraumatic, no cyanosis or edema Skin: Skin color, texture, turgor normal. No rashes  or lesions Lymph nodes: Cervical, supraclavicular, and axillary nodes normal. No abnormal inguinal nodes palpated Neurologic: Grossly normal   Pelvic: External genitalia:  no lesions              Urethra:  normal appearing urethra with no masses, tenderness or lesions              Bartholins and Skenes: normal                 Vagina: normal appearing vagina with normal color and discharge, no lesions              Cervix: no lesions               Bimanual Exam:  Uterus:  normal size, contour, position, consistency, mobility, non-tender              Adnexa: no mass, fullness, tenderness               Rectovaginal: Confirms               Anus:  normal sphincter tone, no lesions  Chaperone was present for exam.  A:  Well Woman with normal exam  On HRT she wants to continue, knows she needs to get her mammogram. Aware of risks  Vit d def  P:   Pap with hpv  Mammogram  Discussed colon cancer screening, once her life is less stressful she will do a cologuard  Discussed breast self exam  Discussed calcium and vit D intake  Screening labs

## 2018-08-14 NOTE — Patient Instructions (Signed)
EXERCISE AND DIET:  We recommended that you start or continue a regular exercise program for good health. Regular exercise means any activity that makes your heart beat faster and makes you sweat.  We recommend exercising at least 30 minutes per day at least 3 days a week, preferably 4 or 5.  We also recommend a diet low in fat and sugar.  Inactivity, poor dietary choices and obesity can cause diabetes, heart attack, stroke, and kidney damage, among others.    ALCOHOL AND SMOKING:  Women should limit their alcohol intake to no more than 7 drinks/beers/glasses of wine (combined, not each!) per week. Moderation of alcohol intake to this level decreases your risk of breast cancer and liver damage. And of course, no recreational drugs are part of a healthy lifestyle.  And absolutely no smoking or even second hand smoke. Most people know smoking can cause heart and lung diseases, but did you know it also contributes to weakening of your bones? Aging of your skin?  Yellowing of your teeth and nails?  CALCIUM AND VITAMIN D:  Adequate intake of calcium and Vitamin D are recommended.  The recommendations for exact amounts of these supplements seem to change often, but generally speaking 1,200 mg of calcium (between diet and supplement) and 800 units of Vitamin D per day seems prudent. Certain women may benefit from higher intake of Vitamin D.  If you are among these women, your doctor will have told you during your visit.    PAP SMEARS:  Pap smears, to check for cervical cancer or precancers,  have traditionally been done yearly, although recent scientific advances have shown that most women can have pap smears less often.  However, every woman still should have a physical exam from her gynecologist every year. It will include a breast check, inspection of the vulva and vagina to check for abnormal growths or skin changes, a visual exam of the cervix, and then an exam to evaluate the size and shape of the uterus and  ovaries.  And after 60 years of age, a rectal exam is indicated to check for rectal cancers. We will also provide age appropriate advice regarding health maintenance, like when you should have certain vaccines, screening for sexually transmitted diseases, bone density testing, colonoscopy, mammograms, etc.   MAMMOGRAMS:  All women over 40 years old should have a yearly mammogram. Many facilities now offer a "3D" mammogram, which may cost around $50 extra out of pocket. If possible,  we recommend you accept the option to have the 3D mammogram performed.  It both reduces the number of women who will be called back for extra views which then turn out to be normal, and it is better than the routine mammogram at detecting truly abnormal areas.    COLONOSCOPY:  Colonoscopy to screen for colon cancer is recommended for all women at age 50.  We know, you hate the idea of the prep.  We agree, BUT, having colon cancer and not knowing it is worse!!  Colon cancer so often starts as a polyp that can be seen and removed at colonscopy, which can quite literally save your life!  And if your first colonoscopy is normal and you have no family history of colon cancer, most women don't have to have it again for 10 years.  Once every ten years, you can do something that may end up saving your life, right?  We will be happy to help you get it scheduled when you are ready.    Be sure to check your insurance coverage so you understand how much it will cost.  It may be covered as a preventative service at no cost, but you should check your particular policy.      Breast Self-Awareness Breast self-awareness means being familiar with how your breasts look and feel. It involves checking your breasts regularly and reporting any changes to your health care provider. Practicing breast self-awareness is important. A change in your breasts can be a sign of a serious medical problem. Being familiar with how your breasts look and feel allows  you to find any problems early, when treatment is more likely to be successful. All women should practice breast self-awareness, including women who have had breast implants. How to do a breast self-exam One way to learn what is normal for your breasts and whether your breasts are changing is to do a breast self-exam. To do a breast self-exam: Look for Changes  1. Remove all the clothing above your waist. 2. Stand in front of a mirror in a room with good lighting. 3. Put your hands on your hips. 4. Push your hands firmly downward. 5. Compare your breasts in the mirror. Look for differences between them (asymmetry), such as: ? Differences in shape. ? Differences in size. ? Puckers, dips, and bumps in one breast and not the other. 6. Look at each breast for changes in your skin, such as: ? Redness. ? Scaly areas. 7. Look for changes in your nipples, such as: ? Discharge. ? Bleeding. ? Dimpling. ? Redness. ? A change in position. Feel for Changes  Carefully feel your breasts for lumps and changes. It is best to do this while lying on your back on the floor and again while sitting or standing in the shower or tub with soapy water on your skin. Feel each breast in the following way:  Place the arm on the side of the breast you are examining above your head.  Feel your breast with the other hand.  Start in the nipple area and make  inch (2 cm) overlapping circles to feel your breast. Use the pads of your three middle fingers to do this. Apply light pressure, then medium pressure, then firm pressure. The light pressure will allow you to feel the tissue closest to the skin. The medium pressure will allow you to feel the tissue that is a little deeper. The firm pressure will allow you to feel the tissue close to the ribs.  Continue the overlapping circles, moving downward over the breast until you feel your ribs below your breast.  Move one finger-width toward the center of the body.  Continue to use the  inch (2 cm) overlapping circles to feel your breast as you move slowly up toward your collarbone.  Continue the up and down exam using all three pressures until you reach your armpit.  Write Down What You Find  Write down what is normal for each breast and any changes that you find. Keep a written record with breast changes or normal findings for each breast. By writing this information down, you do not need to depend only on memory for size, tenderness, or location. Write down where you are in your menstrual cycle, if you are still menstruating. If you are having trouble noticing differences in your breasts, do not get discouraged. With time you will become more familiar with the variations in your breasts and more comfortable with the exam. How often should I examine my breasts? Examine   your breasts every month. If you are breastfeeding, the best time to examine your breasts is after a feeding or after using a breast pump. If you menstruate, the best time to examine your breasts is 5-7 days after your period is over. During your period, your breasts are lumpier, and it may be more difficult to notice changes. When should I see my health care provider? See your health care provider if you notice:  A change in shape or size of your breasts or nipples.  A change in the skin of your breast or nipples, such as a reddened or scaly area.  Unusual discharge from your nipples.  A lump or thick area that was not there before.  Pain in your breasts.  Anything that concerns you.  This information is not intended to replace advice given to you by your health care provider. Make sure you discuss any questions you have with your health care provider. Document Released: 10/04/2005 Document Revised: 03/11/2016 Document Reviewed: 08/24/2015 Elsevier Interactive Patient Education  2018 Elsevier Inc.  

## 2018-08-15 LAB — COMPREHENSIVE METABOLIC PANEL
ALBUMIN: 4.6 g/dL (ref 3.6–4.8)
ALT: 13 IU/L (ref 0–32)
AST: 23 IU/L (ref 0–40)
Albumin/Globulin Ratio: 2.3 — ABNORMAL HIGH (ref 1.2–2.2)
Alkaline Phosphatase: 46 IU/L (ref 39–117)
BUN / CREAT RATIO: 24 (ref 12–28)
BUN: 17 mg/dL (ref 8–27)
Bilirubin Total: 0.3 mg/dL (ref 0.0–1.2)
CALCIUM: 8.8 mg/dL (ref 8.7–10.3)
CO2: 24 mmol/L (ref 20–29)
CREATININE: 0.7 mg/dL (ref 0.57–1.00)
Chloride: 103 mmol/L (ref 96–106)
GFR, EST AFRICAN AMERICAN: 109 mL/min/{1.73_m2} (ref >59)
GFR, EST NON AFRICAN AMERICAN: 94 mL/min/{1.73_m2} (ref >59)
GLOBULIN, TOTAL: 2 g/dL (ref 1.5–4.5)
Glucose: 83 mg/dL (ref 65–99)
Potassium: 3.9 mmol/L (ref 3.5–5.2)
SODIUM: 143 mmol/L (ref 134–144)
Total Protein: 6.6 g/dL (ref 6.0–8.5)

## 2018-08-15 LAB — LIPID PANEL
CHOL/HDL RATIO: 3.7 ratio (ref 0.0–4.4)
Cholesterol, Total: 227 mg/dL — ABNORMAL HIGH (ref 100–199)
HDL: 62 mg/dL (ref >39)
LDL CALC: 135 mg/dL — AB (ref 0–99)
Triglycerides: 150 mg/dL — ABNORMAL HIGH (ref 0–149)
VLDL Cholesterol Cal: 30 mg/dL (ref 5–40)

## 2018-08-15 LAB — CBC
HEMATOCRIT: 41 % (ref 34.0–46.6)
HEMOGLOBIN: 13.6 g/dL (ref 11.1–15.9)
MCH: 31.6 pg (ref 26.6–33.0)
MCHC: 33.2 g/dL (ref 31.5–35.7)
MCV: 95 fL (ref 79–97)
Platelets: 227 10*3/uL (ref 150–450)
RBC: 4.3 x10E6/uL (ref 3.77–5.28)
RDW: 12.3 % (ref 12.3–15.4)
WBC: 5.3 10*3/uL (ref 3.4–10.8)

## 2018-08-15 LAB — VITAMIN D 25 HYDROXY (VIT D DEFICIENCY, FRACTURES): Vit D, 25-Hydroxy: 29.4 ng/mL — ABNORMAL LOW (ref 30.0–100.0)

## 2018-08-16 LAB — CYTOLOGY - PAP
Diagnosis: NEGATIVE
HPV (WINDOPATH): NOT DETECTED

## 2018-08-17 ENCOUNTER — Telehealth: Payer: Self-pay

## 2018-08-17 NOTE — Telephone Encounter (Signed)
Left message to call Mandel Seiden at [phone]. 

## 2018-08-17 NOTE — Telephone Encounter (Signed)
-----   Message from Romualdo Bolk, MD sent at 08/16/2018 12:46 PM EDT ----- Please inform the patient that her total cholesterol and LDL are mildly elevated, her triglycerides were just over normal (better than last year). The rest of her lipid panel was normal. She should eat a healthy diet, exercise regularly and get a fasting lipid panel in a year.  Her vit d was just under normal. She should increase her current vit d intake by 800 IU a day (long term). Pap and hpv were normal.  Pap 02 recall

## 2018-08-18 NOTE — Telephone Encounter (Signed)
Spoke with patient, advised of results as seen below per Dr. Oscar La. Patient is currently scheduled for next AEX 08/16/19 at 2pm, request to reschedule to earlier time to accommodate fasting labs. AEX rescheduled for 08/29/19 at 9am. Patient verbalizes understanding and is agreeable. Encounter closed.

## 2018-08-18 NOTE — Telephone Encounter (Signed)
Patient left voicemail stating that she was returning call to [facility].

## 2018-09-06 ENCOUNTER — Other Ambulatory Visit: Payer: Self-pay | Admitting: Obstetrics and Gynecology

## 2018-09-06 MED ORDER — PROGESTERONE MICRONIZED 100 MG PO CAPS: 100.0000 mg | ORAL_CAPSULE | Freq: Every day | ORAL | 0 refills | Status: DC

## 2018-09-06 MED ORDER — ESTRADIOL 0.05 MG/24HR TD PTWK: 0.0500 mg | MEDICATED_PATCH | TRANSDERMAL | 0 refills | Status: DC

## 2018-09-06 NOTE — Telephone Encounter (Signed)
Patient states that Dr.Jertson was supposed to send in refills of her "patch" and "medication that she takes nightly". She is unsure of the names of these prescriptions, confirmed pharmacy on file.

## 2018-09-06 NOTE — Telephone Encounter (Signed)
Medication refill request: climara  Last AEX:  08-14-18 JJ  Next AEX: 08-29-19 Last MMG (if hormonal medication request): 10-9-18Stable probably benign left breast calcifications.No mammographic evidence of malignancy in the right breast- DX left breast MMG due now Refill authorized: please advise  Medication refill request: progesterone Refill authorized: please advise  Patient returned call. Patient requesting refills on climara and progesterone. RN advised MMG is due. Patient is not currently scheduled. States Dr. Oscar LaJertson is aware she has a lot going on and is spending 80+ hours a week with her mom. Patient states that Dr. Oscar LaJertson said she would refill 3 months and she needed to get her MMG done in that time frame. RN advised would send request to Dr. Oscar LaJertson to review. Patient agreeable. Pharmacy confirmed.  Medications pended for 3 month supply for Dr. Oscar LaJertson to review.

## 2018-09-06 NOTE — Telephone Encounter (Signed)
Script sent  

## 2018-09-06 NOTE — Telephone Encounter (Signed)
Message left to return call to Emily at [phone].    

## 2018-09-07 NOTE — Telephone Encounter (Signed)
Climara RX faxed to [facility].   Encounter closed.

## 2018-11-02 ENCOUNTER — Telehealth: Payer: Self-pay

## 2018-11-02 NOTE — Telephone Encounter (Signed)
Patient is in 04 recall for a 6 month f/u for breast imaging at [facility]. Please contact patient to schedule.

## 2018-11-02 NOTE — Telephone Encounter (Signed)
Dr.Silva, patient was notified regarding the need for breast imaging follow up on 08/01/2018 and declined to scheduled. She was seen for aex on 08/14/2018 and this was discussed at that time as well. Patient not scheduled. Okay to send letter and remove from recall?

## 2018-11-03 NOTE — Telephone Encounter (Signed)
Ok to send letter and then remove from recall. 

## 2018-11-06 NOTE — Telephone Encounter (Signed)
Letter to Dr.Jertson for review and signature. 

## 2018-11-07 NOTE — Telephone Encounter (Signed)
Letter mailed to patient's home address on file and removed from recall. Encounter closed.

## 2019-01-01 ENCOUNTER — Other Ambulatory Visit: Payer: Self-pay | Admitting: Obstetrics and Gynecology

## 2019-01-01 ENCOUNTER — Telehealth: Payer: Self-pay | Admitting: Obstetrics and Gynecology

## 2019-01-01 DIAGNOSIS — R921 Mammographic calcification found on diagnostic imaging of breast: Secondary | ICD-10-CM

## 2019-01-01 NOTE — Telephone Encounter (Signed)
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   Call to patient, confirmed last MMG 07/26/17. Advised as seen above. Advised patient she would need to proceed with bilateral DX MMG to cover screening and f/u imaging.   Patient requesting to proceed with screening MMG only. States her mother died last wk, she has no insurance and no breast concerns. Condolences to patient. Patient states she has spoken with [facility] and was advised it was doctors discretion to proceed with screening MMG only. Patient requesting return call from Dr. Oscar La directly. Advised I will forward message to Dr. Oscar La, advised she is seeing patients, response may not be immediate. Patient verbalizes understanding.    Call placed to [facility], spoke with Victorino Dike, was advised patient will need to proceed with bilateral Dx MMG to cover screening and f/u imaging.  Routing to Dr. Oscar La

## 2019-01-01 NOTE — Telephone Encounter (Signed)
The [facility] center has patient scheduled for diagnostic mammogram but she says she needs a screening only.

## 2019-01-01 NOTE — Telephone Encounter (Signed)
Spoke with Darl Pikes, Nurse Navigator at [facility]. Was advised patient will have to schedule as bilateral Dx MMG to f/u with previous imaging and to cover screening. No information provided for billing, patient would need to contact billing directly.

## 2019-01-01 NOTE — Telephone Encounter (Signed)
Message to [employer]. Was advised they can reach out to patient to determine if they can provide assistance with Dx MMG.    Call to patient, advised as seen below. Patient states she will contact [facility] to schedule bilateral Dx MMG. Patient states she does not want assistance from [facility]. Order placed for bilateral Dx MMG and left breast US, if needed. Patient states she will need refills of HRT coming up soon. Advised patient she will need updated MMG for refills of HRT, patient verbalizes understanding.   Routing to provider for final review. Patient is agreeable to disposition. Will close encounter.

## 2019-01-01 NOTE — Telephone Encounter (Signed)
Can you please call the breast center and review the scenario with them. See what options they offer.

## 2019-01-05 ENCOUNTER — Ambulatory Visit
Admission: RE | Admit: 2019-01-05 | Discharge: 2019-01-05 | Disposition: A | Discharge status: Home / Self Care | Payer: No Typology Code available for payment source | Source: Ambulatory Visit | Attending: Obstetrics and Gynecology | Admitting: Obstetrics and Gynecology

## 2019-01-05 ENCOUNTER — Other Ambulatory Visit: Payer: Self-pay | Admitting: Emergency Medicine

## 2019-01-05 ENCOUNTER — Other Ambulatory Visit: Payer: Self-pay

## 2019-01-05 ENCOUNTER — Ambulatory Visit: Payer: No Typology Code available for payment source

## 2019-01-05 ENCOUNTER — Other Ambulatory Visit: Payer: Self-pay | Admitting: Obstetrics and Gynecology

## 2019-01-05 DIAGNOSIS — R921 Mammographic calcification found on diagnostic imaging of breast: Secondary | ICD-10-CM

## 2019-01-05 MED ORDER — ESTRADIOL 0.05 MG/24HR TD PTWK: 0.0500 mg | MEDICATED_PATCH | TRANSDERMAL | 2 refills | Status: DC

## 2019-01-05 MED ORDER — PROGESTERONE MICRONIZED 100 MG PO CAPS: 100.0000 mg | ORAL_CAPSULE | Freq: Every day | ORAL | 2 refills | Status: DC

## 2019-08-16 ENCOUNTER — Ambulatory Visit: Payer: No Typology Code available for payment source | Admitting: Obstetrics and Gynecology

## 2019-08-27 ENCOUNTER — Other Ambulatory Visit: Payer: Self-pay

## 2019-08-27 NOTE — Progress Notes (Signed)
63 y.o. [id number] Married White or Caucasian Not Hispanic or Latino female here for annual exam.  She is on HRT, doing well and wants to continue.  Sexually active, no pain. No vaginal bleeding.   Mom died in 2023-01-19.     Patient's last menstrual period was 08/07/2012.          Sexually active: Yes.    The current method of family planning is post menopausal status.    Exercising: Yes.    teaching deep water aerobics, lifting Smoker:  no  Health Maintenance: Pap:08/14/2018 WNL NEG HR HPV, 06-12-15 WNL NEG HPV History of abnormal Pap:yes years ago MMG:2019-01-19 Birads 2 benign Colonoscopy:Never UXL:KGMWN TDaP:08-08-2017 Gardasil:N/A   reports that she has never smoked. She has never used smokeless tobacco. She reports that she does not drink alcohol or use drugs.  Past Medical History:  Diagnosis Date  . Complication of anesthesia   . Family history of adverse reaction to anesthesia    family members had nausea/vomiting with anesthesia.  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 History of dysmenorrhea   . History of ovarian cyst 5/02  . Plantar fasciitis 2004  . PONV (postoperative nausea and vomiting)     Past Surgical History:  Procedure Laterality Date  . DILATATION & CURETTAGE/HYSTEROSCOPY WITH MYOSURE N/A 12/15/2015   Procedure: DILATATION & CURETTAGE/HYSTEROSCOPY WITH MYOSURE;  Surgeon: Salvadore Dom, MD;  Location: [facility];  Service: Gynecology;  Laterality: N/A;  . DILATION AND CURETTAGE OF UTERUS    . OTHER SURGICAL HISTORY Left    Dx Scope LOA cautery     Current Outpatient Medications  Medication Sig Dispense Refill  . estradiol (CLIMARA - DOSED IN MG/24 HR) 0.05 mg/24hr patch Place 1 patch (0.05 mg total) onto the skin once a week. 12 patch 2  . progesterone (PROMETRIUM) 100 MG capsule Take 1 capsule (100 mg total) by mouth daily. 1 tab po qhs 90 capsule 2   No current facility-administered medications for this visit.     Family  History  Problem Relation Age of Onset  . Cancer Father 50       colon  . Stroke Father 89       stroke from afib  . Heart attack Father   . Fuch's dystrophy Mother     Review of Systems  Constitutional: Negative.   HENT: Negative.   Eyes: Negative.   Respiratory: Negative.   Cardiovascular: Negative.   Gastrointestinal: Negative.   Endocrine: Negative.   Genitourinary: Negative.   Musculoskeletal: Negative.   Skin: Negative.   Allergic/Immunologic: Negative.   Neurological: Negative.   Hematological: Negative.   Psychiatric/Behavioral: Negative.     Exam:   BP 108/72 (BP Location: Right Arm, Patient Position: Sitting, Cuff Size: Normal)   Pulse 80   Temp 97.9 F (36.6 C) (Skin)   Wt 164 lb 9.6 oz (74.7 kg)   LMP 08/07/2012   BMI 24.31 kg/m   Weight change: @WEIGHTCHANGE @ Height:      Ht Readings from Last 3 Encounters:  08/14/18 5\' 9"  (1.753 m)  08/08/17 5\' 9"  (1.753 m)  06/17/16 5\' 9"  (1.753 m)    General appearance: alert, cooperative and appears stated age Head: Normocephalic, without obvious abnormality, atraumatic Neck: no adenopathy, supple, symmetrical, trachea midline and thyroid normal to inspection and palpation Lungs: clear to auscultation bilaterally Cardiovascular: regular rate and rhythm Breasts: normal appearance, no masses or tenderness Abdomen: soft, non-tender; non distended,  no masses,  no organomegaly  Extremities: extremities normal, atraumatic, no cyanosis or edema Skin: Skin color, texture, turgor normal. No rashes or lesions Lymph nodes: Cervical, supraclavicular, and axillary nodes normal. No abnormal inguinal nodes palpated Neurologic: Grossly normal   Pelvic: External genitalia:  no lesions              Urethra:  normal appearing urethra with no masses, tenderness or lesions              Bartholins and Skenes: normal                 Vagina: normal appearing vagina with normal color and discharge, no lesions              Cervix:  no lesions               Bimanual Exam:  Uterus:  normal size, contour, position, consistency, mobility, non-tender              Adnexa: no mass, fullness, tenderness               Rectovaginal: Confirms               Anus:  normal sphincter tone, no lesions  Chaperone was present for exam.  A:  Well Woman with normal exam  HRT, doing well, wants to continue, aware of risks  P:   No pap this year  Mammogram due in 3/21  Declines colon cancer screening  Declines screening labs  Discussed breast self exam  Discussed calcium and vit D intake

## 2019-08-29 ENCOUNTER — Other Ambulatory Visit: Payer: Self-pay

## 2019-08-29 ENCOUNTER — Telehealth: Payer: Self-pay | Admitting: Obstetrics and Gynecology

## 2019-08-29 ENCOUNTER — Ambulatory Visit (INDEPENDENT_AMBULATORY_CARE_PROVIDER_SITE_OTHER): Payer: Self-pay | Admitting: Obstetrics and Gynecology

## 2019-08-29 ENCOUNTER — Encounter: Payer: Self-pay | Admitting: Obstetrics and Gynecology

## 2019-08-29 ENCOUNTER — Telehealth: Payer: Self-pay | Admitting: *Deleted

## 2019-08-29 VITALS — BP 108/72 | HR 80 | Temp 97.9°F | Ht 69.0 in | Wt 164.6 lb

## 2019-08-29 DIAGNOSIS — Z79890 Hormone replacement therapy (postmenopausal): Secondary | ICD-10-CM

## 2019-08-29 DIAGNOSIS — Z01419 Encounter for gynecological examination (general) (routine) without abnormal findings: Secondary | ICD-10-CM

## 2019-08-29 DIAGNOSIS — E559 Vitamin D deficiency, unspecified: Secondary | ICD-10-CM

## 2019-08-29 MED ORDER — ESTRADIOL 0.05 MG/24HR TD PTWK: 0.0500 mg | MEDICATED_PATCH | TRANSDERMAL | 3 refills | Status: DC

## 2019-08-29 MED ORDER — PROGESTERONE MICRONIZED 100 MG PO CAPS: 100.0000 mg | ORAL_CAPSULE | Freq: Every day | ORAL | 3 refills | Status: DC

## 2019-08-29 NOTE — Patient Instructions (Signed)

## 2019-08-29 NOTE — Telephone Encounter (Signed)
Patient returned call

## 2019-08-29 NOTE — Telephone Encounter (Signed)
Encounter closed in error. See next encounter with today's date.

## 2019-08-29 NOTE — Telephone Encounter (Signed)
Call returned to patient, left detailed message, ok per dpr. Advised height at todays OV 5"9". Return call to office if any additional questions.   Encounter closed.

## 2019-08-29 NOTE — Telephone Encounter (Signed)
Patient is asking what was her height at her aex today? She is wondering if there has been any changes since her last aex?

## 2019-08-29 NOTE — Telephone Encounter (Signed)
Left message to call Jasmin Pimple, RN at [facility].     Jasmin Woods      Telephone Encounter  Signed  Creation Time:  08/29/2019 9:49 AM          Signed        Patient is asking what was her height at her aex today? She is wondering if there has been any changes since her last aex?

## 2020-07-23 ENCOUNTER — Other Ambulatory Visit: Payer: Self-pay | Admitting: Obstetrics and Gynecology

## 2020-07-23 DIAGNOSIS — Z1231 Encounter for screening mammogram for malignant neoplasm of breast: Secondary | ICD-10-CM

## 2020-08-18 ENCOUNTER — Ambulatory Visit
Admission: RE | Admit: 2020-08-18 | Discharge: 2020-08-18 | Disposition: A | Discharge status: Home / Self Care | Payer: No Typology Code available for payment source | Source: Ambulatory Visit | Attending: Obstetrics and Gynecology | Admitting: Obstetrics and Gynecology

## 2020-08-18 ENCOUNTER — Other Ambulatory Visit: Payer: Self-pay

## 2020-08-18 DIAGNOSIS — Z1231 Encounter for screening mammogram for malignant neoplasm of breast: Secondary | ICD-10-CM

## 2020-08-22 DIAGNOSIS — M19049 Primary osteoarthritis, unspecified hand: Secondary | ICD-10-CM | POA: Insufficient documentation

## 2020-09-01 NOTE — Progress Notes (Deleted)
64 y.o. [id number] Married White or Caucasian Not Hispanic or Latino female here for annual exam.      Patient's last menstrual period was 08/07/2012.          Sexually active: {yes no:314532}  The current method of family planning is {contraception:315051}.    Exercising: {yes no:314532}  {types:19826} Smoker:  {YES NO:22349}  Health Maintenance: Pap: 08/14/2018 WNL NEG HR HPV, 06-12-15 WNL NEG HPV  History of abnormal Pap:  Yes years ago  MMG:  08/20/20 density B Bi-rads 1 neg  BMD:   None  Colonoscopy: never  TDaP:  08/08/17  Gardasil: NA    reports that she has never smoked. She has never used smokeless tobacco. She reports that she does not drink alcohol and does not use drugs.  Past Medical History:  Diagnosis Date  . Complication of anesthesia   . Family history of adverse reaction to anesthesia    family members had nausea/vomiting with anesthesia.  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 History of dysmenorrhea   . History of ovarian cyst 5/02  . Plantar fasciitis 2004  . PONV (postoperative nausea and vomiting)     Past Surgical History:  Procedure Laterality Date  . DILATATION & CURETTAGE/HYSTEROSCOPY WITH MYOSURE N/A 12/15/2015   Procedure: DILATATION & CURETTAGE/HYSTEROSCOPY WITH MYOSURE;  Surgeon: Romualdo Bolk, MD;  Location: [facility];  Service: Gynecology;  Laterality: N/A;  . DILATION AND CURETTAGE OF UTERUS    . OTHER SURGICAL HISTORY Left    Dx Scope LOA cautery     Current Outpatient Medications  Medication Sig Dispense Refill  . estradiol (CLIMARA - DOSED IN MG/24 HR) 0.05 mg/24hr patch Place 1 patch (0.05 mg total) onto the skin once a week. 12 patch 3  . progesterone (PROMETRIUM) 100 MG capsule Take 1 capsule (100 mg total) by mouth daily. 1 tab po qhs 90 capsule 3   No current facility-administered medications for this visit.    Family History  Problem Relation Age of Onset  . Cancer Father 56       colon  . Stroke Father 22        stroke from afib  . Heart attack Father   . Fuch's dystrophy Mother     Review of Systems  Exam:   LMP 08/07/2012   Weight change: @WEIGHTCHANGE @ Height:      Ht Readings from Last 3 Encounters:  08/29/19 5\' 9"  (1.753 m)  08/14/18 5\' 9"  (1.753 m)  08/08/17 5\' 9"  (1.753 m)    General appearance: alert, cooperative and appears stated age Head: Normocephalic, without obvious abnormality, atraumatic Neck: no adenopathy, supple, symmetrical, trachea midline and thyroid {CHL AMB PHY EX THYROID NORM DEFAULT:[phone]::"normal to inspection and palpation"} Lungs: clear to auscultation bilaterally Cardiovascular: regular rate and rhythm Breasts: {Exam; breast:13139::"normal appearance, no masses or tenderness"} Abdomen: soft, non-tender; non distended,  no masses,  no organomegaly Extremities: extremities normal, atraumatic, no cyanosis or edema Skin: Skin color, texture, turgor normal. No rashes or lesions Lymph nodes: Cervical, supraclavicular, and axillary nodes normal. No abnormal inguinal nodes palpated Neurologic: Grossly normal   Pelvic: External genitalia:  no lesions              Urethra:  normal appearing urethra with no masses, tenderness or lesions              Bartholins and Skenes: normal                 Vagina:  normal appearing vagina with normal color and discharge, no lesions              Cervix: {CHL AMB PHY EX CERVIX NORM [phone]::"no lesions"}               Bimanual Exam:  Uterus:  {CHL AMB PHY EX UTERUS NORM [phone]::"normal size, contour, position, consistency, mobility, non-tender"}              Adnexa: {CHL AMB PHY EX ADNEXA NO MASS DEFAULT:[phone]::"no mass, fullness, tenderness"}               Rectovaginal: Confirms               Anus:  normal sphincter tone, no lesions  *** chaperoned for the exam.  A:  Well Woman with normal exam  P:

## 2020-09-03 ENCOUNTER — Telehealth: Payer: Self-pay

## 2020-09-03 ENCOUNTER — Ambulatory Visit: Payer: Self-pay | Admitting: Obstetrics and Gynecology

## 2020-09-03 NOTE — Telephone Encounter (Signed)
Opened in error

## 2020-10-01 DIAGNOSIS — M65312 Trigger thumb, left thumb: Secondary | ICD-10-CM | POA: Insufficient documentation

## 2020-10-01 DIAGNOSIS — M79645 Pain in left finger(s): Secondary | ICD-10-CM | POA: Insufficient documentation

## 2020-11-21 ENCOUNTER — Other Ambulatory Visit: Payer: Self-pay | Admitting: Obstetrics and Gynecology

## 2020-11-21 NOTE — Telephone Encounter (Signed)
Annual exam scheduled on 02/18/21 

## 2021-02-12 NOTE — Progress Notes (Signed)
65 y.o. [id number] Married White or Caucasian Not Hispanic or Latino female here for annual exam.  She is doing well on HRT and wants to continue. She is aware of the risks.  No dyspareunia. No vaginal bleeding.  She has rare urge incontinence. No other bladder c/o, no bowel c/o.     Patient's last menstrual period was 08/07/2012.          Sexually active: Yes.    The current method of family planning is post menopausal status.    Exercising: Yes.    Gym/ health club routine includes swimming. Smoker:  no  Health Maintenance: Pap: 08/14/2018 WNL NEG HR HPV, 06-12-15 WNL NEG HPV History of abnormal Pap:  Yes its been years MMG:  08/20/20 density B Bi-rads 1 neg BMD:   Never  Colonoscopy:never  TDaP:  08/08/17  Gardasil: none    reports that she has never smoked. She has never used smokeless tobacco. She reports that she does not drink alcohol and does not use drugs. She works at [employer], Producer, television/film/video   Past Medical History:  Diagnosis Date  . Complication of anesthesia   . Family history of adverse reaction to anesthesia    family members had nausea/vomiting with anesthesia.  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 History of dysmenorrhea   . History of ovarian cyst 5/02  . Plantar fasciitis 2004  . PONV (postoperative nausea and vomiting)     Past Surgical History:  Procedure Laterality Date  . DILATATION & CURETTAGE/HYSTEROSCOPY WITH MYOSURE N/A 12/15/2015   Procedure: DILATATION & CURETTAGE/HYSTEROSCOPY WITH MYOSURE;  Surgeon: Romualdo Bolk, MD;  Location: [facility];  Service: Gynecology;  Laterality: N/A;  . DILATION AND CURETTAGE OF UTERUS    . OTHER SURGICAL HISTORY Left    Dx Scope LOA cautery     Current Outpatient Medications  Medication Sig Dispense Refill  . estradiol (CLIMARA - DOSED IN MG/24 HR) 0.05 mg/24hr patch PLACE 1 PATCH ONTO THE SKIN ONCE A WEEK 12 patch 1  . progesterone (PROMETRIUM) 100 MG capsule TAKE 1 CAPSULE BY MOUTH EVERY  EVENING AT BEDTIME 90 capsule 1   No current facility-administered medications for this visit.    Family History  Problem Relation Age of Onset  . Cancer Father 73       colon  . Stroke Father 75       stroke from afib  . Heart attack Father   . Fuch's dystrophy Mother     Review of Systems  All other systems reviewed and are negative.   Exam:   BP 118/80 (BP Location: Right Arm, Patient Position: Sitting, Cuff Size: Normal)   Ht 5\' 9"  (1.753 m)   Wt 159 lb (72.1 kg)   LMP 08/07/2012   BMI 23.48 kg/m   Weight change: @WEIGHTCHANGE @ Height:   Height: 5\' 9"  (175.3 cm)  Ht Readings from Last 3 Encounters:  02/18/21 5\' 9"  (1.753 m)  08/29/19 5\' 9"  (1.753 m)  08/14/18 5\' 9"  (1.753 m)    General appearance: alert, cooperative and appears stated age Head: Normocephalic, without obvious abnormality, atraumatic Neck: no adenopathy, supple, symmetrical, trachea midline and thyroid normal to inspection and palpation Lungs: clear to auscultation bilaterally Cardiovascular: regular rate and rhythm Breasts: normal appearance, no masses or tenderness Abdomen: soft, non-tender; non distended,  no masses,  no organomegaly Extremities: extremities normal, atraumatic, no cyanosis or edema Skin: Skin color, texture, turgor normal. No rashes or lesions Lymph  nodes: Cervical, supraclavicular, and axillary nodes normal. No abnormal inguinal nodes palpated Neurologic: Grossly normal   Pelvic: External genitalia:  no lesions              Urethra:  normal appearing urethra with no masses, tenderness or lesions              Bartholins and Skenes: normal                 Vagina: normal appearing vagina with normal color and discharge, no lesions              Cervix: no lesions               Bimanual Exam:  Uterus:  normal size, contour, position, consistency, mobility, non-tender              Adnexa: no mass, fullness, tenderness               Rectovaginal: Confirms               Anus:   normal sphincter tone, no lesions  Bari Mantis chaperoned for the exam.  1. Well woman exam Discussed breast self exam Discussed calcium and vit D intake No pap this year Mammogram UTD  2. Colon cancer screening Strongly encouraged colonoscopy, she declines colonoscopy - Fecal Globin By Immunochemistry-([facility])  3. Vitamin D deficiency She is on 5,000 IU a day - VITAMIN D 25 Hydroxy (Vit-D Deficiency, Fractures)  4. Hormone replacement therapy (HRT) Wants to continue, aware of risks - progesterone (PROMETRIUM) 100 MG capsule; TAKE 1 CAPSULE BY MOUTH EVERY EVENING AT BEDTIME  Dispense: 90 capsule; Refill: 3 - estradiol (CLIMARA - DOSED IN MG/24 HR) 0.05 mg/24hr patch; PLACE 1 PATCH ONTO THE SKIN ONCE A WEEK  Dispense: 12 patch; Refill: 3  5. Laboratory exam ordered as part of routine general medical examination - CBC - Comprehensive metabolic panel - Lipid panel

## 2021-02-18 ENCOUNTER — Encounter: Payer: Self-pay | Admitting: Obstetrics and Gynecology

## 2021-02-18 ENCOUNTER — Ambulatory Visit (INDEPENDENT_AMBULATORY_CARE_PROVIDER_SITE_OTHER): Payer: No Typology Code available for payment source | Admitting: Obstetrics and Gynecology

## 2021-02-18 ENCOUNTER — Other Ambulatory Visit: Payer: Self-pay

## 2021-02-18 VITALS — BP 118/80 | Ht 69.0 in | Wt 159.0 lb

## 2021-02-18 DIAGNOSIS — Z01419 Encounter for gynecological examination (general) (routine) without abnormal findings: Secondary | ICD-10-CM

## 2021-02-18 DIAGNOSIS — Z1211 Encounter for screening for malignant neoplasm of colon: Secondary | ICD-10-CM

## 2021-02-18 DIAGNOSIS — Z79890 Hormone replacement therapy (postmenopausal): Secondary | ICD-10-CM | POA: Diagnosis not present

## 2021-02-18 DIAGNOSIS — Z0000 Encounter for general adult medical examination without abnormal findings: Secondary | ICD-10-CM

## 2021-02-18 DIAGNOSIS — E559 Vitamin D deficiency, unspecified: Secondary | ICD-10-CM

## 2021-02-18 MED ORDER — ESTRADIOL 0.05 MG/24HR TD PTWK: MEDICATED_PATCH | TRANSDERMAL | 3 refills | Status: DC

## 2021-02-18 MED ORDER — PROGESTERONE MICRONIZED 100 MG PO CAPS: ORAL_CAPSULE | ORAL | 3 refills | Status: DC

## 2021-02-18 NOTE — Patient Instructions (Signed)

## 2021-02-19 LAB — COMPREHENSIVE METABOLIC PANEL
AG Ratio: 2.1 (calc) (ref 1.0–2.5)
ALT: 12 U/L (ref 6–29)
AST: 21 U/L (ref 10–35)
Albumin: 4.6 g/dL (ref 3.6–5.1)
Alkaline phosphatase (APISO): 39 U/L (ref 37–153)
BUN: 14 mg/dL (ref 7–25)
CO2: 27 mmol/L (ref 20–32)
Calcium: 9.1 mg/dL (ref 8.6–10.4)
Chloride: 104 mmol/L (ref 98–110)
Creat: 0.74 mg/dL (ref 0.50–0.99)
Globulin: 2.2 g/dL (calc) (ref 1.9–3.7)
Glucose, Bld: 89 mg/dL (ref 65–99)
Potassium: 4.4 mmol/L (ref 3.5–5.3)
Sodium: 138 mmol/L (ref 135–146)
Total Bilirubin: 0.5 mg/dL (ref 0.2–1.2)
Total Protein: 6.8 g/dL (ref 6.1–8.1)

## 2021-02-19 LAB — VITAMIN D 25 HYDROXY (VIT D DEFICIENCY, FRACTURES): Vit D, 25-Hydroxy: 42 ng/mL (ref 30–100)

## 2021-02-19 LAB — LIPID PANEL
Cholesterol: 269 mg/dL — ABNORMAL HIGH (ref <200)
HDL: 72 mg/dL (ref >=50)
LDL Cholesterol (Calc): 178 mg/dL (calc) — ABNORMAL HIGH
Non-HDL Cholesterol (Calc): 197 mg/dL (calc) — ABNORMAL HIGH (ref <130)
Total CHOL/HDL Ratio: 3.7 (calc) (ref <5.0)
Triglycerides: 85 mg/dL (ref <150)

## 2021-02-19 LAB — CBC
HCT: 41.2 % (ref 35.0–45.0)
Hemoglobin: 13.6 g/dL (ref 11.7–15.5)
MCH: 31.8 pg (ref 27.0–33.0)
MCHC: 33 g/dL (ref 32.0–36.0)
MCV: 96.3 fL (ref 80.0–100.0)
MPV: 8.9 fL (ref 7.5–12.5)
Platelets: 220 10*3/uL (ref 140–400)
RBC: 4.28 10*6/uL (ref 3.80–5.10)
RDW: 12.1 % (ref 11.0–15.0)
WBC: 4 10*3/uL (ref 3.8–10.8)

## 2022-02-17 NOTE — Progress Notes (Signed)
66 y.o. [id number] Married White or Caucasian Not Hispanic or Latino female here for annual exam.  She is on HRT, doing well and wants to continue. No dyspareunia.  ?  ? ?Patient's last menstrual period was 08/07/2012.          ?Sexually active: Yes.    ?The current method of family planning is postmenopausal status.  ?Exercising: Yes.    The patient has a physically strenuous job, but has no regular exercise apart from work.  ?Smoker:  no ? ?Health Maintenance: ?Pap:  08/14/2018 WNL NEG HR HPV , 06-12-15 WNL NEG HPV ?History of abnormal Pap:  yes Years ago. F/u was normal. No surgery on her cervix ?MMG:  08/18/20 Density B Bi-rads 1 neg  ?BMD:   never  ?Colonoscopy: never  ?TDaP:  08/08/17  ?Gardasil: n/a ? ? reports that she has never smoked. She has never used smokeless tobacco. She reports that she does not drink alcohol and does not use drugs. 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 3 kids, all in [place]. No grandchildren.  ? ?Past Medical History:  ?Diagnosis Date  ? Complication of anesthesia   ? Family history of adverse reaction to anesthesia   ? family members had nausea/vomiting with anesthesia.  ? Fuchs' heterochromic cyclitis of both eyes   ? History of dysmenorrhea   ? History of ovarian cyst 5/02  ? Plantar fasciitis 2004  ? PONV (postoperative nausea and vomiting)   ? ? ?Past Surgical History:  ?Procedure Laterality Date  ? DILATATION & CURETTAGE/HYSTEROSCOPY WITH MYOSURE N/A 12/15/2015  ? Procedure: DILATATION & CURETTAGE/HYSTEROSCOPY WITH MYOSURE;  Surgeon: Salvadore Dom, MD;  Location: [facility];  Service: Gynecology;  Laterality: N/A;  ? DILATION AND CURETTAGE OF UTERUS    ? OTHER SURGICAL HISTORY Left   ? Dx Scope LOA cautery   ? ? ?Current Outpatient Medications  ?Medication Sig Dispense Refill  ? estradiol (CLIMARA - DOSED IN MG/24 HR) 0.05 mg/24hr patch PLACE 1 PATCH ONTO THE SKIN ONCE A WEEK 12 patch 3  ? progesterone (PROMETRIUM) 100 MG capsule TAKE 1 CAPSULE BY MOUTH EVERY EVENING  AT BEDTIME 90 capsule 3  ? ?No current facility-administered medications for this visit.  ? ? ?Family History  ?Problem Relation Age of Onset  ? Cancer Father 90  ?     colon  ? Stroke Father 30  ?     stroke from afib  ? Heart attack Father   ? Fuch's dystrophy Mother   ? ? ?Review of Systems  ?All other systems reviewed and are negative. ? ?Exam:   ?LMP 08/07/2012   Weight change: @WEIGHTCHANGE @ Height:      ?Ht Readings from Last 3 Encounters:  ?02/18/21 5\' 9"  (1.753 m)  ?08/29/19 5\' 9"  (1.753 m)  ?08/14/18 5\' 9"  (1.753 m)  ? ? ?General appearance: alert, cooperative and appears stated age ?Head: Normocephalic, without obvious abnormality, atraumatic ?Neck: no adenopathy, supple, symmetrical, trachea midline and thyroid normal to inspection and palpation ?Lungs: clear to auscultation bilaterally ?Cardiovascular: regular rate and rhythm ?Breasts: normal appearance, no masses or tenderness ?Abdomen: soft, non-tender; non distended,  no masses,  no organomegaly ?Extremities: extremities normal, atraumatic, no cyanosis or edema ?Skin: Skin color, texture, turgor normal. No rashes or lesions ?Lymph nodes: Cervical, supraclavicular, and axillary nodes normal. ?No abnormal inguinal nodes palpated ?Neurologic: Grossly normal ? ? ?Pelvic: External genitalia:  no lesions ?             Urethra:  normal appearing urethra with no masses, tenderness or lesions ?             Bartholins and Skenes: normal    ?             Vagina: normal appearing vagina with normal color and discharge, no lesions ?             Cervix: no lesions ?              ?Bimanual Exam:  Uterus:  normal size, contour, position, consistency, mobility, non-tender ?             Adnexa: no mass, fullness, tenderness ?              Rectovaginal: Confirms ?              Anus:  normal sphincter tone, no lesions ? ?Gae Dry chaperoned for the exam. ? ?1. Well woman exam ?Discussed breast self exam ?Discussed calcium and vit D intake ?Mammogram overdue, she will  schedule ?Pap next year ? ?2. Vitamin D deficiency ?- VITAMIN D 25 Hydroxy (Vit-D Deficiency, Fractures) ? ?3. Hormone replacement therapy (HRT) ?Aware of risks, wants to continue ?- estradiol (CLIMARA - DOSED IN MG/24 HR) 0.05 mg/24hr patch; PLACE 1 PATCH ONTO THE SKIN ONCE A WEEK  Dispense: 12 patch; Refill: 3 ?- progesterone (PROMETRIUM) 100 MG capsule; TAKE 1 CAPSULE BY MOUTH EVERY EVENING AT BEDTIME  Dispense: 90 capsule; Refill: 3 ? ?4. Laboratory exam ordered as part of routine general medical examination ?- CBC ?- Comprehensive metabolic panel ?- Lipid panel ? ?5. Elevated LDL cholesterol level ?- Lipid panel ? ?6. Colon cancer screening ?She is hesitant to do testing, declines colonoscopy. Will consider IFOB (will send home with IFOB). She understands the reasoning for testing. Her father had colon cancer.  ?- Fecal occult blood, imunochemical ? ? ?

## 2022-02-24 ENCOUNTER — Ambulatory Visit (INDEPENDENT_AMBULATORY_CARE_PROVIDER_SITE_OTHER): Payer: No Typology Code available for payment source | Admitting: Obstetrics and Gynecology

## 2022-02-24 ENCOUNTER — Encounter: Payer: Self-pay | Admitting: Obstetrics and Gynecology

## 2022-02-24 VITALS — BP 124/66 | HR 77 | Ht 68.5 in | Wt 161.0 lb

## 2022-02-24 DIAGNOSIS — E559 Vitamin D deficiency, unspecified: Secondary | ICD-10-CM | POA: Diagnosis not present

## 2022-02-24 DIAGNOSIS — Z79890 Hormone replacement therapy (postmenopausal): Secondary | ICD-10-CM

## 2022-02-24 DIAGNOSIS — E7800 Pure hypercholesterolemia, unspecified: Secondary | ICD-10-CM

## 2022-02-24 DIAGNOSIS — Z01419 Encounter for gynecological examination (general) (routine) without abnormal findings: Secondary | ICD-10-CM

## 2022-02-24 DIAGNOSIS — Z1211 Encounter for screening for malignant neoplasm of colon: Secondary | ICD-10-CM

## 2022-02-24 DIAGNOSIS — L570 Actinic keratosis: Secondary | ICD-10-CM | POA: Insufficient documentation

## 2022-02-24 DIAGNOSIS — Z0000 Encounter for general adult medical examination without abnormal findings: Secondary | ICD-10-CM

## 2022-02-24 DIAGNOSIS — L818 Other specified disorders of pigmentation: Secondary | ICD-10-CM | POA: Insufficient documentation

## 2022-02-24 MED ORDER — ESTRADIOL 0.05 MG/24HR TD PTWK: MEDICATED_PATCH | TRANSDERMAL | 3 refills | Status: DC

## 2022-02-24 MED ORDER — PROGESTERONE MICRONIZED 100 MG PO CAPS: ORAL_CAPSULE | ORAL | 3 refills | Status: DC

## 2022-02-24 NOTE — Patient Instructions (Signed)

## 2022-02-25 LAB — COMPREHENSIVE METABOLIC PANEL
AG Ratio: 1.8 (calc) (ref 1.0–2.5)
ALT: 14 U/L (ref 6–29)
AST: 24 U/L (ref 10–35)
Albumin: 4.4 g/dL (ref 3.6–5.1)
Alkaline phosphatase (APISO): 43 U/L (ref 37–153)
BUN: 17 mg/dL (ref 7–25)
CO2: 26 mmol/L (ref 20–32)
Calcium: 9.1 mg/dL (ref 8.6–10.4)
Chloride: 104 mmol/L (ref 98–110)
Creat: 0.73 mg/dL (ref 0.50–1.05)
Globulin: 2.5 g/dL (calc) (ref 1.9–3.7)
Glucose, Bld: 82 mg/dL (ref 65–99)
Potassium: 5.2 mmol/L (ref 3.5–5.3)
Sodium: 139 mmol/L (ref 135–146)
Total Bilirubin: 0.6 mg/dL (ref 0.2–1.2)
Total Protein: 6.9 g/dL (ref 6.1–8.1)

## 2022-02-25 LAB — VITAMIN D 25 HYDROXY (VIT D DEFICIENCY, FRACTURES): Vit D, 25-Hydroxy: 49 ng/mL (ref 30–100)

## 2022-02-25 LAB — CBC
HCT: 39.3 % (ref 35.0–45.0)
Hemoglobin: 13.1 g/dL (ref 11.7–15.5)
MCH: 31.6 pg (ref 27.0–33.0)
MCHC: 33.3 g/dL (ref 32.0–36.0)
MCV: 94.9 fL (ref 80.0–100.0)
MPV: 9.1 fL (ref 7.5–12.5)
Platelets: 205 10*3/uL (ref 140–400)
RBC: 4.14 10*6/uL (ref 3.80–5.10)
RDW: 12.3 % (ref 11.0–15.0)
WBC: 4.7 10*3/uL (ref 3.8–10.8)

## 2022-02-25 LAB — LIPID PANEL
Cholesterol: 265 mg/dL — ABNORMAL HIGH (ref <200)
HDL: 68 mg/dL (ref >=50)
LDL Cholesterol (Calc): 174 mg/dL (calc) — ABNORMAL HIGH
Non-HDL Cholesterol (Calc): 197 mg/dL (calc) — ABNORMAL HIGH (ref <130)
Total CHOL/HDL Ratio: 3.9 (calc) (ref <5.0)
Triglycerides: 106 mg/dL (ref <150)

## 2022-05-04 ENCOUNTER — Telehealth: Payer: Self-pay

## 2022-05-04 NOTE — Telephone Encounter (Signed)
FYI. Pt called re: lab results done in 02/2022. Pt notified of results/recommendations from you that was sent via mychart. Pt voiced understanding.

## 2022-07-03 IMAGING — MG DIGITAL SCREENING BILAT W/ CAD
4 series · 4 of 4 positions shown · non-contrast
Comparison: Previous exam(s).

CLINICAL DATA: Screening.

EXAM:
DIGITAL SCREENING BILATERAL MAMMOGRAM WITH CAD

[R MLO]
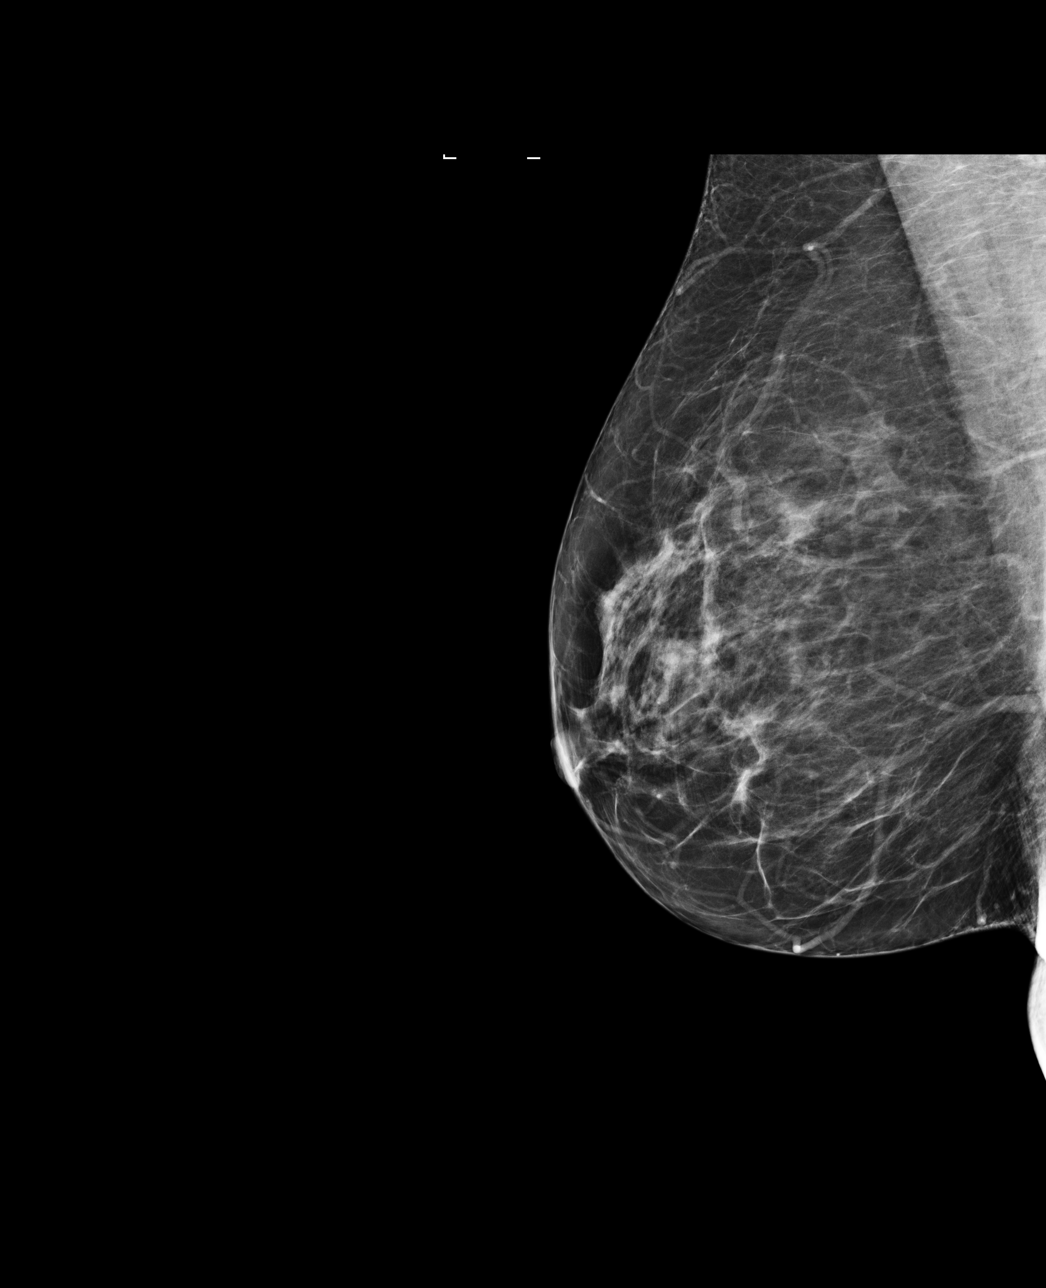

[L CC]
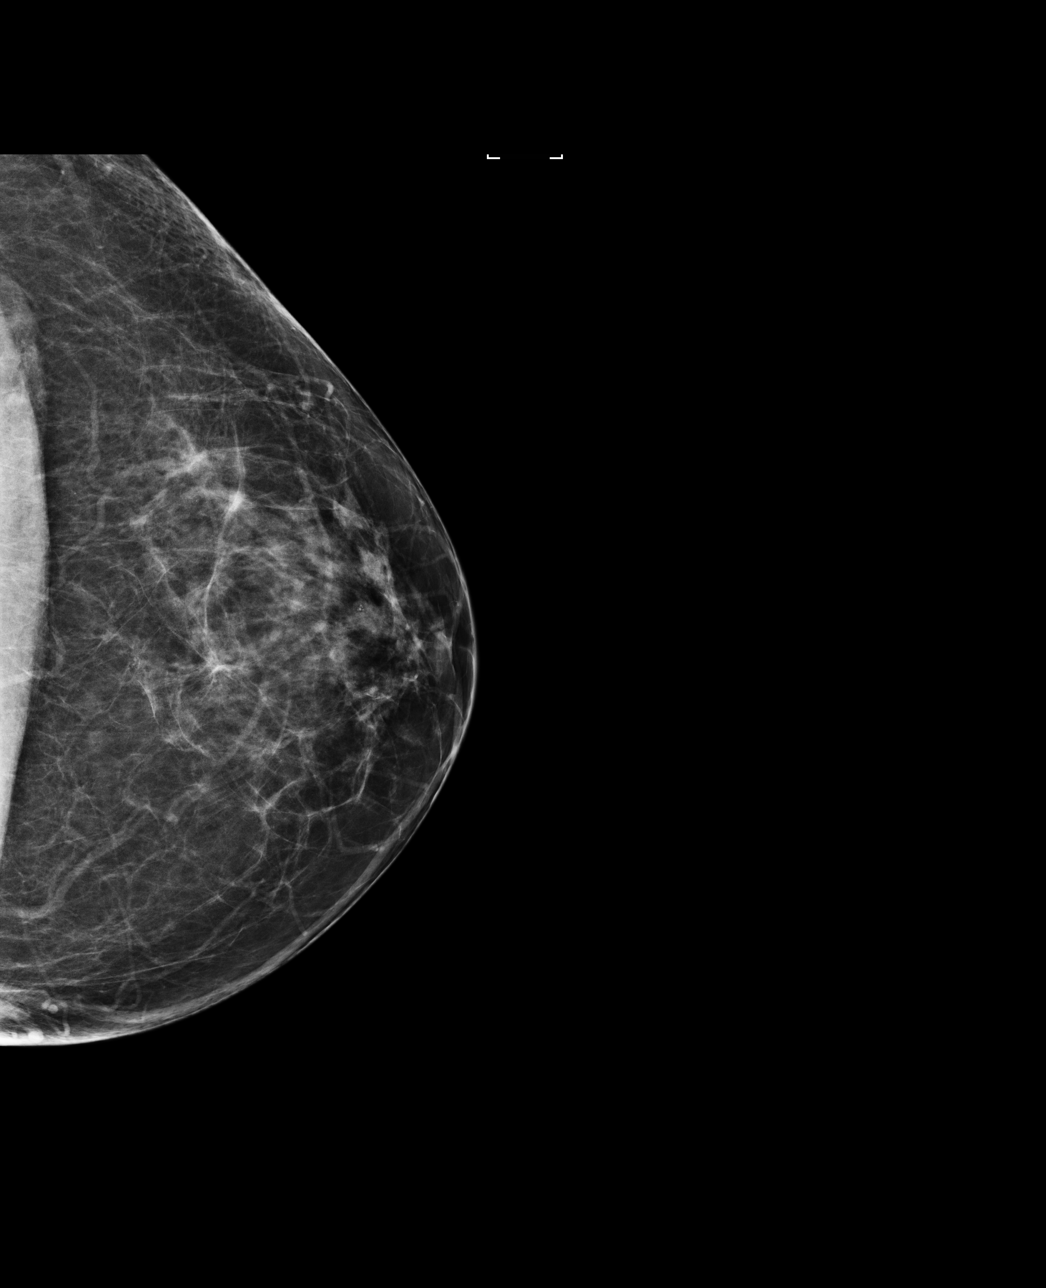

[L MLO]
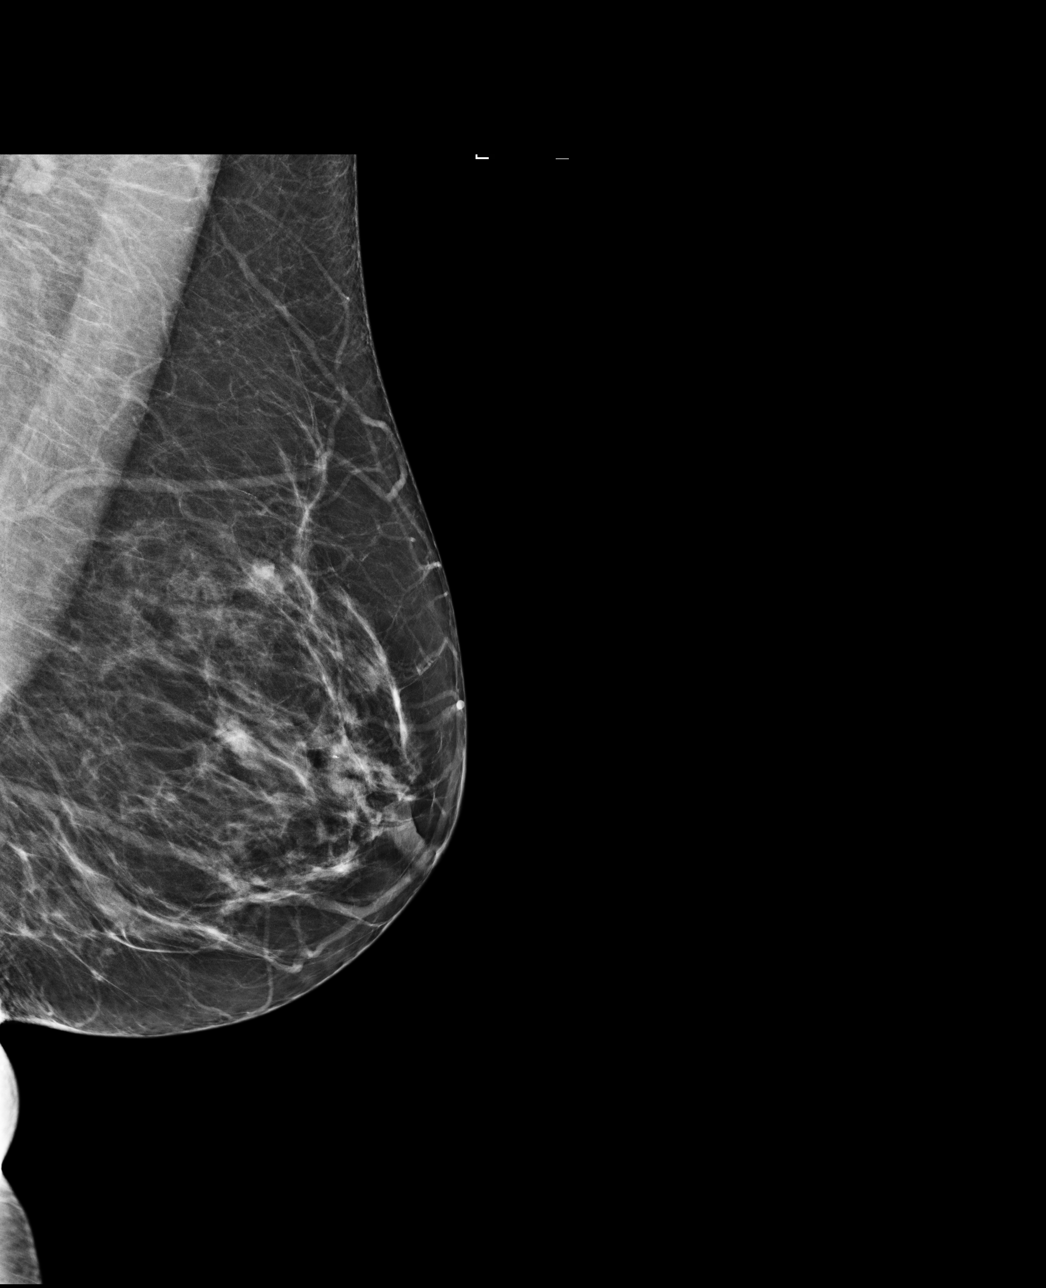

[R CC]
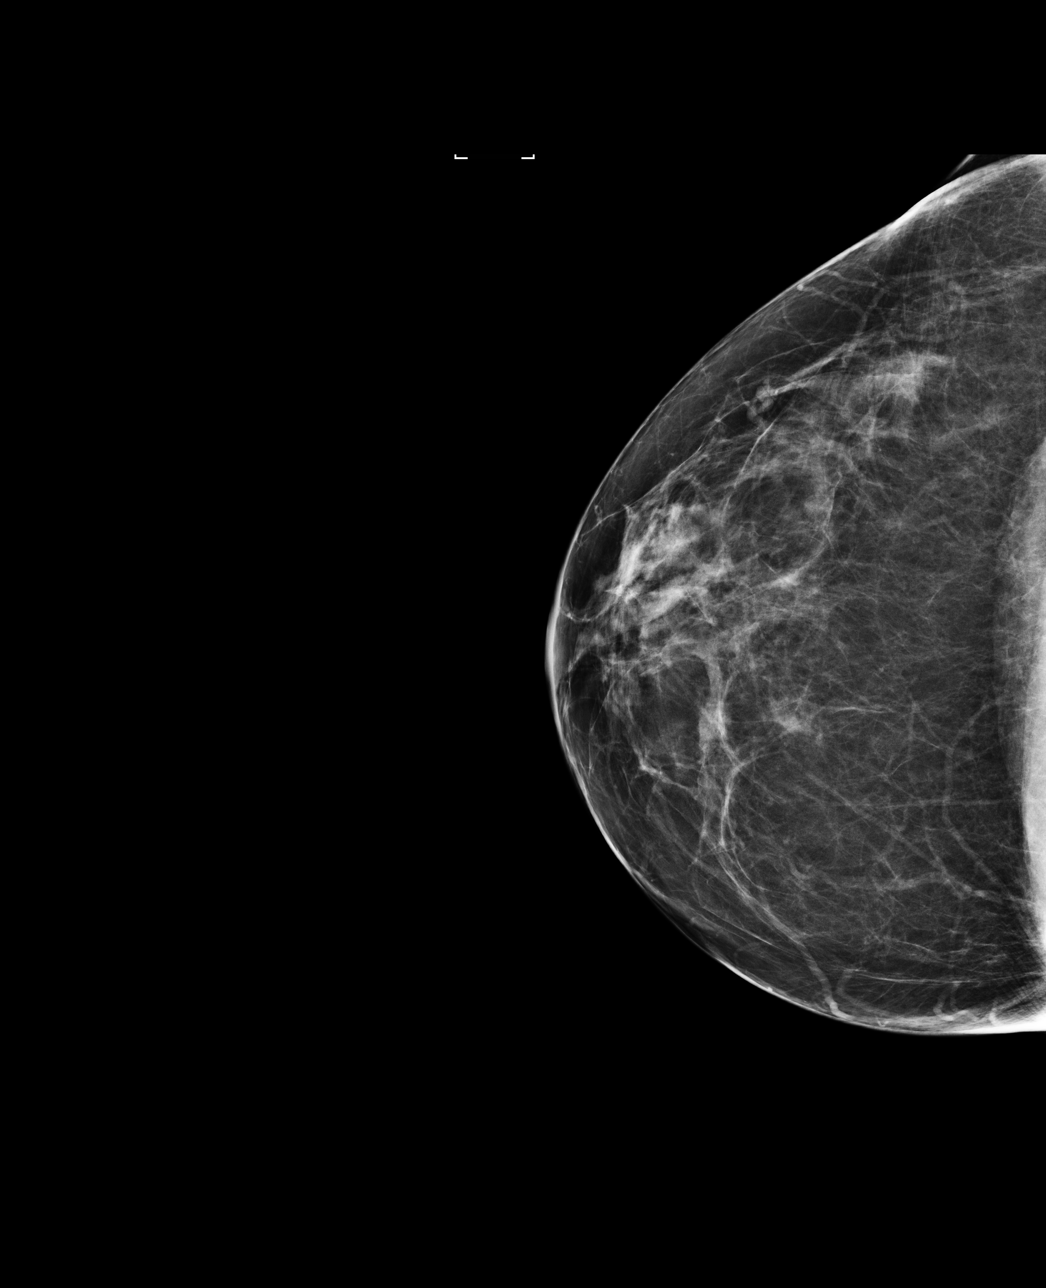

[4 of 4 positions shown; findings below may reference images not displayed]

ACR Breast Density Category b: There are scattered areas of
fibroglandular density.
FINDINGS: There are no findings suspicious for malignancy. Images were
processed with CAD.
IMPRESSION: No mammographic evidence of malignancy. A result letter of this
screening mammogram will be mailed directly to the patient.

RECOMMENDATION:
Screening mammogram in one year. (Code:AS-G-LCT)

BI-RADS CATEGORY  1: Negative.

## 2023-02-02 ENCOUNTER — Other Ambulatory Visit: Payer: Self-pay | Admitting: *Deleted

## 2023-02-02 DIAGNOSIS — Z79890 Hormone replacement therapy (postmenopausal): Secondary | ICD-10-CM

## 2023-02-02 MED ORDER — PROGESTERONE MICRONIZED 100 MG PO CAPS: ORAL_CAPSULE | ORAL | 0 refills | Status: DC

## 2023-02-02 MED ORDER — ESTRADIOL 0.05 MG/24HR TD PTWK: MEDICATED_PATCH | TRANSDERMAL | 0 refills | Status: DC

## 2023-02-02 NOTE — Telephone Encounter (Signed)
Call returned to patient, left detailed message, ok per dpr. Advised  per Dr. Oscar La. Return call to office if any additional questions.

## 2023-02-02 NOTE — Telephone Encounter (Signed)
Med refill request: estradiol 0.05 mg patch twice weekly and progesterone 100 mg PO daily Last AEX: 02/24/22 -JJ Next AEX: 03/29/23 -JJ Last MMG (if hormonal med) 08/18/20, BiRads 1 neg at [facility] with patient. Patient states she thinks that she has had an updated MMG, advised last MMG on file at [facility] 08/18/20. Patient states she will check he records and return call to advise if MMG completed at another location. Patient is requesting refill until AEX. Advised if no MMG will need to update to continue HRT. Patient verbalizes understanding and is agreeable.    Refill authorized: Please Advise?

## 2023-02-02 NOTE — Telephone Encounter (Signed)
3 month refill sent, she will need to have an updated mammogram for further refills.

## 2023-03-02 ENCOUNTER — Ambulatory Visit: Payer: No Typology Code available for payment source | Admitting: Obstetrics and Gynecology

## 2023-03-21 NOTE — Progress Notes (Deleted)
67 y.o. [id number] Married White or Caucasian Not Hispanic or Latino female here for annual exam.      Patient's last menstrual period was 08/07/2012.          Sexually active: {yes no:314532}  The current method of family planning is {contraception:315051}.    Exercising: {yes no:314532}  {types:19826} Smoker:  {YES [id number]  Health Maintenance: Pap:  08/14/2018 WNL NEG HR HPV , 06-12-15 WNL NEG HPV  History of abnormal Pap:  {YES NO:22349} MMG:  08/18/20 Bi-rads 1 neg BMD:   never  Colonoscopy: never *** TDaP:  08/08/17 Gardasil: n/a   reports that she has never smoked. She has never used smokeless tobacco. She reports that she does not drink alcohol and does not use drugs.  Past Medical History:  Diagnosis Date   Complication of anesthesia    Family history of adverse reaction to anesthesia    family members had nausea/vomiting with anesthesia.   Fuchs' heterochromic cyclitis of both eyes    History of dysmenorrhea    History of ovarian cyst 5/02   Plantar fasciitis 2004   PONV (postoperative nausea and vomiting)     Past Surgical History:  Procedure Laterality Date   DILATATION & CURETTAGE/HYSTEROSCOPY WITH MYOSURE N/A 12/15/2015   Procedure: DILATATION & CURETTAGE/HYSTEROSCOPY WITH MYOSURE;  Surgeon: Romualdo Bolk, MD;  Location: [facility];  Service: Gynecology;  Laterality: N/A;   DILATION AND CURETTAGE OF UTERUS     OTHER SURGICAL HISTORY Left    Dx Scope LOA cautery     Current Outpatient Medications  Medication Sig Dispense Refill   estradiol (CLIMARA - DOSED IN MG/24 HR) 0.05 mg/24hr patch PLACE 1 PATCH ONTO THE SKIN ONCE A WEEK 12 patch 0   progesterone (PROMETRIUM) 100 MG capsule TAKE 1 CAPSULE BY MOUTH EVERY EVENING AT BEDTIME 90 capsule 0   No current facility-administered medications for this visit.    Family History  Problem Relation Age of Onset   Cancer Father 48       colon   Stroke Father 5       stroke from afib   Heart attack Father     Fuch's dystrophy Mother     Review of Systems  Exam:   LMP 08/07/2012   Weight change: @WEIGHTCHANGE @ Height:      Ht Readings from Last 3 Encounters:  02/24/22 5' 8.5" (1.74 m)  02/18/21 5\' 9"  (1.753 m)  08/29/19 5\' 9"  (1.753 m)    General appearance: alert, cooperative and appears stated age Head: Normocephalic, without obvious abnormality, atraumatic Neck: no adenopathy, supple, symmetrical, trachea midline and thyroid {CHL AMB PHY EX THYROID NORM DEFAULT:[phone]::"normal to inspection and palpation"} Lungs: clear to auscultation bilaterally Cardiovascular: regular rate and rhythm Breasts: {Exam; breast:13139::"normal appearance, no masses or tenderness"} Abdomen: soft, non-tender; non distended,  no masses,  no organomegaly Extremities: extremities normal, atraumatic, no cyanosis or edema Skin: Skin color, texture, turgor normal. No rashes or lesions Lymph nodes: Cervical, supraclavicular, and axillary nodes normal. No abnormal inguinal nodes palpated Neurologic: Grossly normal   Pelvic: External genitalia:  no lesions              Urethra:  normal appearing urethra with no masses, tenderness or lesions              Bartholins and Skenes: normal                 Vagina: normal appearing vagina with normal color and discharge, no lesions  Cervix: {CHL AMB PHY EX CERVIX NORM DEFAULT:[id number]::"no lesions"}               Bimanual Exam:  Uterus:  {CHL AMB PHY EX UTERUS NORM DEFAULT:[phone]::"normal size, contour, position, consistency, mobility, non-tender"}              Adnexa: {CHL AMB PHY EX ADNEXA NO MASS [phone]::"no mass, fullness, tenderness"}               Rectovaginal: Confirms               Anus:  normal sphincter tone, no lesions  *** chaperoned for the exam.  A:  Well Woman with normal exam  P:

## 2023-03-25 ENCOUNTER — Other Ambulatory Visit: Payer: Self-pay | Admitting: Obstetrics and Gynecology

## 2023-03-25 DIAGNOSIS — Z1231 Encounter for screening mammogram for malignant neoplasm of breast: Secondary | ICD-10-CM

## 2023-03-29 ENCOUNTER — Ambulatory Visit: Payer: No Typology Code available for payment source | Admitting: Obstetrics and Gynecology

## 2023-05-23 ENCOUNTER — Ambulatory Visit
Admission: RE | Admit: 2023-05-23 | Discharge: 2023-05-23 | Disposition: A | Discharge status: Home / Self Care | Payer: Medicare HMO | Source: Ambulatory Visit | Attending: Obstetrics and Gynecology | Admitting: Obstetrics and Gynecology

## 2023-05-23 DIAGNOSIS — Z1231 Encounter for screening mammogram for malignant neoplasm of breast: Secondary | ICD-10-CM

## 2023-05-27 ENCOUNTER — Other Ambulatory Visit: Payer: Self-pay | Admitting: Family Medicine

## 2023-05-27 DIAGNOSIS — R928 Other abnormal and inconclusive findings on diagnostic imaging of breast: Secondary | ICD-10-CM

## 2023-06-02 ENCOUNTER — Ambulatory Visit: Payer: Medicare HMO | Admitting: Radiology

## 2023-06-02 ENCOUNTER — Encounter: Payer: Self-pay | Admitting: Radiology

## 2023-06-02 ENCOUNTER — Other Ambulatory Visit (HOSPITAL_COMMUNITY)
Admission: RE | Admit: 2023-06-02 | Discharge: 2023-06-02 | Disposition: A | Discharge status: Home / Self Care | Payer: Medicare HMO | Source: Ambulatory Visit | Attending: Radiology | Admitting: Radiology

## 2023-06-02 VITALS — BP 104/62 | Ht 68.5 in | Wt 163.0 lb

## 2023-06-02 DIAGNOSIS — Z13820 Encounter for screening for osteoporosis: Secondary | ICD-10-CM

## 2023-06-02 DIAGNOSIS — Z1151 Encounter for screening for human papillomavirus (HPV): Secondary | ICD-10-CM | POA: Diagnosis not present

## 2023-06-02 DIAGNOSIS — Z01419 Encounter for gynecological examination (general) (routine) without abnormal findings: Secondary | ICD-10-CM | POA: Diagnosis not present

## 2023-06-02 DIAGNOSIS — Z79890 Hormone replacement therapy (postmenopausal): Secondary | ICD-10-CM | POA: Diagnosis not present

## 2023-06-02 DIAGNOSIS — Z1211 Encounter for screening for malignant neoplasm of colon: Secondary | ICD-10-CM

## 2023-06-02 MED ORDER — ESTRADIOL 0.05 MG/24HR TD PTTW: 1.0000 | MEDICATED_PATCH | TRANSDERMAL | 4 refills | Status: DC

## 2023-06-02 MED ORDER — PROGESTERONE MICRONIZED 100 MG PO CAPS: ORAL_CAPSULE | ORAL | 4 refills | Status: DC

## 2023-06-02 NOTE — Progress Notes (Signed)
Maybeline Bulley Opfer 06-15-1956 [id number]   History: Postmenopausal 67 y.o. presents for annual exam. Climara patch not sticking well, teaches water aerobics 5x/week. Previous patch adhered better. No other gyn concerns.   Gynecologic History Postmenopausal Last Pap: 2019. Results were: normal Last mammogram: 05/23/23. Results were: abnormal, needs repeat for suspicious area Last colonoscopy: never DEXA:never HRT use: current  Obstetric History OB History  Gravida Para Term Preterm AB Living  5 3 3   2 3   SAB IAB Ectopic Multiple Live Births          3    # Outcome Date GA Lbr Len/2nd Weight Sex Type Anes PTL Lv  5 Term 07/1992    M Vag-Spont   LIV  4 Term 02/1989    M Vag-Spont   LIV  3 Term 03/1987    M Vag-Spont   LIV  2 AB           1 AB              The following portions of the patient's history were reviewed and updated as appropriate: allergies, current medications, past family history, past medical history, past social history, past surgical history, and problem list.  Review of Systems Pertinent items noted in HPI and remainder of comprehensive ROS otherwise negative.  Past medical history, past surgical history, family history and social history were all reviewed and documented in the EPIC chart.  Exam:  Vitals:   06/02/23 1030  BP: 104/62  Weight: 163 lb (73.9 kg)  Height: 5' 8.5" (1.74 m)   Body mass index is 24.42 kg/m.  General appearance:  Normal Thyroid:  Symmetrical, normal in size, without palpable masses or nodularity. Respiratory  Auscultation:  Clear without wheezing or rhonchi Cardiovascular  Auscultation:  Regular rate, without rubs, murmurs or gallops  Edema/varicosities:  Not grossly evident Abdominal  Soft,nontender, without masses, guarding or rebound.  Liver/spleen:  No organomegaly noted  Hernia:  None appreciated  Skin  Inspection:  Grossly normal Breasts: Examined lying and sitting.   Right: Without masses,  retractions, nipple discharge or axillary adenopathy.   Left: Without masses, retractions, nipple discharge or axillary adenopathy. Genitourinary   Inguinal/mons:  Normal without inguinal adenopathy  External genitalia:  Normal appearing vulva with no masses, tenderness, or lesions  BUS/Urethra/Skene's glands:  Normal  Vagina:  Normal appearing with normal color and discharge, no lesions. Atrophy: mild   Cervix:  Normal appearing without discharge or lesions  Uterus:  Normal in size, shape and contour.  Midline and mobile, nontender  Adnexa/parametria:     Rt: Normal in size, without masses or tenderness.   Lt: Normal in size, without masses or tenderness.  Anus and perineum: Normal    Raynelle Fanning, CMA present for exam  Assessment/Plan:   1. Well woman exam with routine gynecological exam pap  2. Hormone replacement therapy (HRT) Change patch to vivelle to improve adhesion - estradiol (VIVELLE-DOT) 0.05 MG/24HR patch; Place 1 patch (0.05 mg total) onto the skin 2 (two) times a week.  Dispense: 24 patch; Refill: 4 - progesterone (PROMETRIUM) 100 MG capsule; TAKE 1 CAPSULE BY MOUTH EVERY EVENING AT BEDTIME  Dispense: 90 capsule; Refill: 4  3. Colon cancer screening - Cologuard  4. Screening for osteoporosis - DG Bone Density; Future    Discussed SBE, colonoscopy and DEXA screening as directed. Recommend of exercise weekly, including weight bearing exercise. Encouraged the use of seatbelts and sunscreen.  Return in 1 year Hrt f/u.  B&P 2026  Arlie Solomons B WHNP-BC, 11:11 AM 06/02/2023

## 2023-06-03 ENCOUNTER — Telehealth: Payer: Self-pay

## 2023-06-03 NOTE — Telephone Encounter (Signed)
Common to have some cramping after pap, especially cytobrush. Can recommend NSAIDs or tylenol.

## 2023-06-03 NOTE — Telephone Encounter (Signed)
LVM for pt to cb to speak regarding JC recommendations.

## 2023-06-03 NOTE — Telephone Encounter (Signed)
Pt LVM in triage line stating since her pap test yesterday, she has been having some internal pain/discomfort and is calling to see if this was normal?  Spoke w/ pt and confirmed that what she thinks is going on is a sort of achy/crampy like feeling that hasn't gone away since pap test or seems to be relieving at all yet. Denies any bleeding/spotting. States that she has never had this feeling before after a pap test.  Pt advised at the time of call that it could be normal due to the type of pap collection. Provider's can collect differently. Some can collect with broom/spatula for surface only cells and some can collect cells from internal os with cyto brush (that could be what is causing this discomfort for her).  However, advised her that I would still inquire with provider her thoughts/recommendations if any.   Please advise.

## 2023-06-03 NOTE — Telephone Encounter (Signed)
LDVM on machine per pt's request and DPR.   Encounter closed.

## 2023-06-06 LAB — CYTOLOGY - PAP
Adequacy: ABSENT
Comment: NEGATIVE
Diagnosis: NEGATIVE
High risk HPV: NEGATIVE

## 2023-06-23 ENCOUNTER — Telehealth: Payer: Self-pay

## 2023-06-23 NOTE — Telephone Encounter (Signed)
Pt LVM in triage line stating that since the switch from 0.05mg /24hr patch once weekly to twice weekly on 06/02/2023, she has noticed some improvement but still experiencing night sweats. Pt inquiring if anything additional could be added to her regimen or if she should wait a bit longer to see if new dosing will balance out?  Spoke w/ pt and confirmed that the areas that she has noticed the improvement are: with the adhesive(s) part of things.  Also states that sleep disturbance is still a concern also. Getting woken up in the middle of the night very hot.  Please advise.

## 2023-06-23 NOTE — Telephone Encounter (Signed)
Call returned to patient, left detailed message, ok per dpr. Advised per Clearnce Hasten, return call to office if any additional questions or if symptoms continue after 4 weeks.   Encounter closed.

## 2023-06-23 NOTE — Telephone Encounter (Signed)
I would recommend waiting until she has been on it a full month before changing the dose.

## 2023-08-17 DIAGNOSIS — M9903 Segmental and somatic dysfunction of lumbar region: Secondary | ICD-10-CM | POA: Diagnosis not present

## 2023-08-17 DIAGNOSIS — M9905 Segmental and somatic dysfunction of pelvic region: Secondary | ICD-10-CM | POA: Diagnosis not present

## 2023-08-17 DIAGNOSIS — M9904 Segmental and somatic dysfunction of sacral region: Secondary | ICD-10-CM | POA: Diagnosis not present

## 2023-08-17 DIAGNOSIS — M5136 Other intervertebral disc degeneration, lumbar region with discogenic back pain only: Secondary | ICD-10-CM | POA: Diagnosis not present

## 2023-08-31 NOTE — Progress Notes (Signed)
LVMTCB

## 2023-09-08 NOTE — Progress Notes (Signed)
LVMTCB-08/31/2023-TG

## 2023-09-20 ENCOUNTER — Telehealth: Payer: Self-pay | Admitting: *Deleted

## 2023-09-20 NOTE — Telephone Encounter (Signed)
Call placed to patient, left detailed message, ok per dpr. Advised per Clearnce Hasten, please return call to office at [phone], option 4 to provide update.

## 2023-09-20 NOTE — Telephone Encounter (Signed)
-----   Message from [place], [place] B sent at 08/31/2023  2:23 PM EST ----- Please reach out to patient to see if she had completed cologuard. Thanks ----- Message ----- From: SYSTEM Sent: 08/31/2023  12:14 AM EST To: Tanda Rockers, NP

## 2023-09-28 NOTE — Telephone Encounter (Signed)
 Call placed to patient, left detailed message, ok per dpr. Advised per Clearnce Hasten, please return call to office at [phone], option 4 to provide update.

## 2023-10-03 NOTE — Telephone Encounter (Signed)
No return call from patient.   Routing to [place] to advise.

## 2023-10-04 NOTE — Telephone Encounter (Signed)
Ok to close encounter, will make a note for her next visit she did not complete.

## 2023-10-04 NOTE — Telephone Encounter (Signed)
Encounter closed

## 2023-12-01 DIAGNOSIS — M9905 Segmental and somatic dysfunction of pelvic region: Secondary | ICD-10-CM | POA: Diagnosis not present

## 2023-12-01 DIAGNOSIS — M51361 Other intervertebral disc degeneration, lumbar region with lower extremity pain only: Secondary | ICD-10-CM | POA: Diagnosis not present

## 2023-12-01 DIAGNOSIS — M9904 Segmental and somatic dysfunction of sacral region: Secondary | ICD-10-CM | POA: Diagnosis not present

## 2023-12-01 DIAGNOSIS — M9903 Segmental and somatic dysfunction of lumbar region: Secondary | ICD-10-CM | POA: Diagnosis not present

## 2023-12-29 DIAGNOSIS — M9904 Segmental and somatic dysfunction of sacral region: Secondary | ICD-10-CM | POA: Diagnosis not present

## 2023-12-29 DIAGNOSIS — M9905 Segmental and somatic dysfunction of pelvic region: Secondary | ICD-10-CM | POA: Diagnosis not present

## 2023-12-29 DIAGNOSIS — M51361 Other intervertebral disc degeneration, lumbar region with lower extremity pain only: Secondary | ICD-10-CM | POA: Diagnosis not present

## 2023-12-29 DIAGNOSIS — M9903 Segmental and somatic dysfunction of lumbar region: Secondary | ICD-10-CM | POA: Diagnosis not present

## 2024-01-05 DIAGNOSIS — M9903 Segmental and somatic dysfunction of lumbar region: Secondary | ICD-10-CM | POA: Diagnosis not present

## 2024-01-05 DIAGNOSIS — M9904 Segmental and somatic dysfunction of sacral region: Secondary | ICD-10-CM | POA: Diagnosis not present

## 2024-01-05 DIAGNOSIS — M51361 Other intervertebral disc degeneration, lumbar region with lower extremity pain only: Secondary | ICD-10-CM | POA: Diagnosis not present

## 2024-01-05 DIAGNOSIS — M9905 Segmental and somatic dysfunction of pelvic region: Secondary | ICD-10-CM | POA: Diagnosis not present

## 2024-02-23 ENCOUNTER — Telehealth: Payer: Self-pay

## 2024-02-23 ENCOUNTER — Other Ambulatory Visit: Payer: Self-pay | Admitting: Radiology

## 2024-02-23 DIAGNOSIS — N898 Other specified noninflammatory disorders of vagina: Secondary | ICD-10-CM

## 2024-02-23 MED ORDER — ESTRADIOL 0.1 MG/GM VA CREA: 1.0000 g | TOPICAL_CREAM | VAGINAL | 2 refills | Status: DC

## 2024-02-23 NOTE — Telephone Encounter (Signed)
 Patient called stating that she is having some vaginal dryness. She said that at her last visit vaginal estrogen was discussed. She is requesting that the cream be sent to [facility]. Please advise if appropriate . Medication pended for approval.

## 2024-03-14 DIAGNOSIS — M9904 Segmental and somatic dysfunction of sacral region: Secondary | ICD-10-CM | POA: Diagnosis not present

## 2024-03-14 DIAGNOSIS — M51361 Other intervertebral disc degeneration, lumbar region with lower extremity pain only: Secondary | ICD-10-CM | POA: Diagnosis not present

## 2024-03-14 DIAGNOSIS — M9903 Segmental and somatic dysfunction of lumbar region: Secondary | ICD-10-CM | POA: Diagnosis not present

## 2024-03-14 DIAGNOSIS — M9905 Segmental and somatic dysfunction of pelvic region: Secondary | ICD-10-CM | POA: Diagnosis not present

## 2024-03-29 DIAGNOSIS — M9904 Segmental and somatic dysfunction of sacral region: Secondary | ICD-10-CM | POA: Diagnosis not present

## 2024-03-29 DIAGNOSIS — M9903 Segmental and somatic dysfunction of lumbar region: Secondary | ICD-10-CM | POA: Diagnosis not present

## 2024-03-29 DIAGNOSIS — M5136 Other intervertebral disc degeneration, lumbar region with discogenic back pain only: Secondary | ICD-10-CM | POA: Diagnosis not present

## 2024-03-29 DIAGNOSIS — M9905 Segmental and somatic dysfunction of pelvic region: Secondary | ICD-10-CM | POA: Diagnosis not present

## 2024-04-03 DIAGNOSIS — M9904 Segmental and somatic dysfunction of sacral region: Secondary | ICD-10-CM | POA: Diagnosis not present

## 2024-04-03 DIAGNOSIS — M5136 Other intervertebral disc degeneration, lumbar region with discogenic back pain only: Secondary | ICD-10-CM | POA: Diagnosis not present

## 2024-04-03 DIAGNOSIS — M9905 Segmental and somatic dysfunction of pelvic region: Secondary | ICD-10-CM | POA: Diagnosis not present

## 2024-04-03 DIAGNOSIS — M9903 Segmental and somatic dysfunction of lumbar region: Secondary | ICD-10-CM | POA: Diagnosis not present

## 2024-04-05 DIAGNOSIS — M9903 Segmental and somatic dysfunction of lumbar region: Secondary | ICD-10-CM | POA: Diagnosis not present

## 2024-04-05 DIAGNOSIS — M9905 Segmental and somatic dysfunction of pelvic region: Secondary | ICD-10-CM | POA: Diagnosis not present

## 2024-04-05 DIAGNOSIS — M5136 Other intervertebral disc degeneration, lumbar region with discogenic back pain only: Secondary | ICD-10-CM | POA: Diagnosis not present

## 2024-04-05 DIAGNOSIS — M9904 Segmental and somatic dysfunction of sacral region: Secondary | ICD-10-CM | POA: Diagnosis not present

## 2024-04-19 DIAGNOSIS — M9904 Segmental and somatic dysfunction of sacral region: Secondary | ICD-10-CM | POA: Diagnosis not present

## 2024-04-19 DIAGNOSIS — M5136 Other intervertebral disc degeneration, lumbar region with discogenic back pain only: Secondary | ICD-10-CM | POA: Diagnosis not present

## 2024-04-19 DIAGNOSIS — M9903 Segmental and somatic dysfunction of lumbar region: Secondary | ICD-10-CM | POA: Diagnosis not present

## 2024-04-19 DIAGNOSIS — M9905 Segmental and somatic dysfunction of pelvic region: Secondary | ICD-10-CM | POA: Diagnosis not present

## 2024-05-01 DIAGNOSIS — M9903 Segmental and somatic dysfunction of lumbar region: Secondary | ICD-10-CM | POA: Diagnosis not present

## 2024-05-01 DIAGNOSIS — M9904 Segmental and somatic dysfunction of sacral region: Secondary | ICD-10-CM | POA: Diagnosis not present

## 2024-05-01 DIAGNOSIS — M5136 Other intervertebral disc degeneration, lumbar region with discogenic back pain only: Secondary | ICD-10-CM | POA: Diagnosis not present

## 2024-05-01 DIAGNOSIS — M9905 Segmental and somatic dysfunction of pelvic region: Secondary | ICD-10-CM | POA: Diagnosis not present

## 2024-05-08 DIAGNOSIS — M9904 Segmental and somatic dysfunction of sacral region: Secondary | ICD-10-CM | POA: Diagnosis not present

## 2024-05-08 DIAGNOSIS — M5136 Other intervertebral disc degeneration, lumbar region with discogenic back pain only: Secondary | ICD-10-CM | POA: Diagnosis not present

## 2024-05-08 DIAGNOSIS — M9905 Segmental and somatic dysfunction of pelvic region: Secondary | ICD-10-CM | POA: Diagnosis not present

## 2024-05-08 DIAGNOSIS — M9903 Segmental and somatic dysfunction of lumbar region: Secondary | ICD-10-CM | POA: Diagnosis not present

## 2024-06-02 ENCOUNTER — Other Ambulatory Visit: Payer: Self-pay | Admitting: Radiology

## 2024-06-02 DIAGNOSIS — Z79890 Hormone replacement therapy (postmenopausal): Secondary | ICD-10-CM

## 2024-06-04 NOTE — Telephone Encounter (Signed)
 Medication refill request: progesterone   Last AEX:  06/02/23 Next AEX: not scheduled  Last MMG (if hormonal medication request): 05/24/23 incomplete  Refill authorized: please advise

## 2024-06-05 DIAGNOSIS — M9902 Segmental and somatic dysfunction of thoracic region: Secondary | ICD-10-CM | POA: Diagnosis not present

## 2024-06-05 DIAGNOSIS — M9905 Segmental and somatic dysfunction of pelvic region: Secondary | ICD-10-CM | POA: Diagnosis not present

## 2024-06-05 DIAGNOSIS — M51361 Other intervertebral disc degeneration, lumbar region with lower extremity pain only: Secondary | ICD-10-CM | POA: Diagnosis not present

## 2024-06-05 DIAGNOSIS — M9904 Segmental and somatic dysfunction of sacral region: Secondary | ICD-10-CM | POA: Diagnosis not present

## 2024-06-20 DIAGNOSIS — M9902 Segmental and somatic dysfunction of thoracic region: Secondary | ICD-10-CM | POA: Diagnosis not present

## 2024-06-20 DIAGNOSIS — M9904 Segmental and somatic dysfunction of sacral region: Secondary | ICD-10-CM | POA: Diagnosis not present

## 2024-06-20 DIAGNOSIS — M9905 Segmental and somatic dysfunction of pelvic region: Secondary | ICD-10-CM | POA: Diagnosis not present

## 2024-06-20 DIAGNOSIS — M51361 Other intervertebral disc degeneration, lumbar region with lower extremity pain only: Secondary | ICD-10-CM | POA: Diagnosis not present

## 2024-07-09 DIAGNOSIS — M9905 Segmental and somatic dysfunction of pelvic region: Secondary | ICD-10-CM | POA: Diagnosis not present

## 2024-07-09 DIAGNOSIS — M51361 Other intervertebral disc degeneration, lumbar region with lower extremity pain only: Secondary | ICD-10-CM | POA: Diagnosis not present

## 2024-07-09 DIAGNOSIS — M9902 Segmental and somatic dysfunction of thoracic region: Secondary | ICD-10-CM | POA: Diagnosis not present

## 2024-07-09 DIAGNOSIS — M9904 Segmental and somatic dysfunction of sacral region: Secondary | ICD-10-CM | POA: Diagnosis not present

## 2024-08-08 DIAGNOSIS — M9905 Segmental and somatic dysfunction of pelvic region: Secondary | ICD-10-CM | POA: Diagnosis not present

## 2024-08-08 DIAGNOSIS — M9904 Segmental and somatic dysfunction of sacral region: Secondary | ICD-10-CM | POA: Diagnosis not present

## 2024-08-08 DIAGNOSIS — M51361 Other intervertebral disc degeneration, lumbar region with lower extremity pain only: Secondary | ICD-10-CM | POA: Diagnosis not present

## 2024-08-08 DIAGNOSIS — M9903 Segmental and somatic dysfunction of lumbar region: Secondary | ICD-10-CM | POA: Diagnosis not present

## 2024-08-14 DIAGNOSIS — M9904 Segmental and somatic dysfunction of sacral region: Secondary | ICD-10-CM | POA: Diagnosis not present

## 2024-08-14 DIAGNOSIS — M51361 Other intervertebral disc degeneration, lumbar region with lower extremity pain only: Secondary | ICD-10-CM | POA: Diagnosis not present

## 2024-08-14 DIAGNOSIS — M9905 Segmental and somatic dysfunction of pelvic region: Secondary | ICD-10-CM | POA: Diagnosis not present

## 2024-08-14 DIAGNOSIS — M9903 Segmental and somatic dysfunction of lumbar region: Secondary | ICD-10-CM | POA: Diagnosis not present

## 2024-08-22 DIAGNOSIS — M25551 Pain in right hip: Secondary | ICD-10-CM | POA: Diagnosis not present

## 2024-08-22 DIAGNOSIS — M5416 Radiculopathy, lumbar region: Secondary | ICD-10-CM | POA: Diagnosis not present

## 2024-08-23 ENCOUNTER — Other Ambulatory Visit: Payer: Self-pay | Admitting: Radiology

## 2024-08-23 DIAGNOSIS — M25551 Pain in right hip: Secondary | ICD-10-CM | POA: Diagnosis not present

## 2024-08-23 DIAGNOSIS — M5416 Radiculopathy, lumbar region: Secondary | ICD-10-CM | POA: Diagnosis not present

## 2024-08-23 DIAGNOSIS — Z79890 Hormone replacement therapy (postmenopausal): Secondary | ICD-10-CM

## 2024-08-24 NOTE — Telephone Encounter (Signed)
 Medication refill request: vivelle  dot 0.05mg  & prometrium  100mg  Last AEX:  06-02-23 Next AEX: not scheduled Last MMG (if hormonal medication request): 05-23-23 birads 0, needs additional imaging Refill authorized: message sent to scheduling department. Pharmacy comment also placed for patient to schedule aex.Please approve if appropriate

## 2024-08-27 DIAGNOSIS — M5416 Radiculopathy, lumbar region: Secondary | ICD-10-CM | POA: Diagnosis not present

## 2024-08-27 DIAGNOSIS — M25551 Pain in right hip: Secondary | ICD-10-CM | POA: Diagnosis not present

## 2024-08-29 DIAGNOSIS — M25551 Pain in right hip: Secondary | ICD-10-CM | POA: Diagnosis not present

## 2024-08-29 DIAGNOSIS — M5416 Radiculopathy, lumbar region: Secondary | ICD-10-CM | POA: Diagnosis not present

## 2024-08-31 ENCOUNTER — Other Ambulatory Visit: Payer: Self-pay

## 2024-08-31 DIAGNOSIS — Z79890 Hormone replacement therapy (postmenopausal): Secondary | ICD-10-CM

## 2024-08-31 MED ORDER — ESTRADIOL 0.05 MG/24HR TD PTTW: 1.0000 | MEDICATED_PATCH | TRANSDERMAL | 0 refills | Status: DC

## 2024-08-31 MED ORDER — PROGESTERONE MICRONIZED 100 MG PO CAPS: 100.0000 mg | ORAL_CAPSULE | Freq: Every day | ORAL | 0 refills | Status: DC

## 2024-08-31 NOTE — Telephone Encounter (Signed)
 Called Pt back to let her know NP sent more refills to [facility]. Pt will be here October 24, 2024. No answer / Lvm.

## 2024-08-31 NOTE — Telephone Encounter (Signed)
 Pt called to say Arloa Prior only gave her one month's worth of the Progesterone  (Prometrium ) and the Estradiol  (patch).  Pt states she cannot get in to see JC, NP until October 24, 2024 and worries she will run out before her appointment. Pt is asking if NP could just go ahead and send enough refills to last until her appointment on 10/24/24.  Please advise.

## 2024-09-05 DIAGNOSIS — M5416 Radiculopathy, lumbar region: Secondary | ICD-10-CM | POA: Diagnosis not present

## 2024-09-05 DIAGNOSIS — M25551 Pain in right hip: Secondary | ICD-10-CM | POA: Diagnosis not present

## 2024-09-10 DIAGNOSIS — M5416 Radiculopathy, lumbar region: Secondary | ICD-10-CM | POA: Diagnosis not present

## 2024-09-10 DIAGNOSIS — M25551 Pain in right hip: Secondary | ICD-10-CM | POA: Diagnosis not present

## 2024-09-18 DIAGNOSIS — M51361 Other intervertebral disc degeneration, lumbar region with lower extremity pain only: Secondary | ICD-10-CM | POA: Diagnosis not present

## 2024-09-18 DIAGNOSIS — M9904 Segmental and somatic dysfunction of sacral region: Secondary | ICD-10-CM | POA: Diagnosis not present

## 2024-09-18 DIAGNOSIS — M9905 Segmental and somatic dysfunction of pelvic region: Secondary | ICD-10-CM | POA: Diagnosis not present

## 2024-09-18 DIAGNOSIS — M9903 Segmental and somatic dysfunction of lumbar region: Secondary | ICD-10-CM | POA: Diagnosis not present

## 2024-09-25 DIAGNOSIS — M9905 Segmental and somatic dysfunction of pelvic region: Secondary | ICD-10-CM | POA: Diagnosis not present

## 2024-09-25 DIAGNOSIS — M51361 Other intervertebral disc degeneration, lumbar region with lower extremity pain only: Secondary | ICD-10-CM | POA: Diagnosis not present

## 2024-09-25 DIAGNOSIS — M9904 Segmental and somatic dysfunction of sacral region: Secondary | ICD-10-CM | POA: Diagnosis not present

## 2024-09-25 DIAGNOSIS — M9903 Segmental and somatic dysfunction of lumbar region: Secondary | ICD-10-CM | POA: Diagnosis not present

## 2024-10-24 ENCOUNTER — Ambulatory Visit: Admitting: Radiology

## 2024-11-06 ENCOUNTER — Ambulatory Visit: Payer: Self-pay | Admitting: Radiology

## 2024-11-06 ENCOUNTER — Encounter: Payer: Self-pay | Admitting: Radiology

## 2024-11-06 VITALS — BP 120/62 | HR 84 | Ht 68.5 in | Wt 166.0 lb

## 2024-11-06 DIAGNOSIS — R61 Generalized hyperhidrosis: Secondary | ICD-10-CM

## 2024-11-06 DIAGNOSIS — N952 Postmenopausal atrophic vaginitis: Secondary | ICD-10-CM | POA: Diagnosis not present

## 2024-11-06 DIAGNOSIS — Z79890 Hormone replacement therapy (postmenopausal): Secondary | ICD-10-CM

## 2024-11-06 DIAGNOSIS — N898 Other specified noninflammatory disorders of vagina: Secondary | ICD-10-CM

## 2024-11-06 DIAGNOSIS — Z1211 Encounter for screening for malignant neoplasm of colon: Secondary | ICD-10-CM

## 2024-11-06 MED ORDER — PROGESTERONE MICRONIZED 100 MG PO CAPS: 100.0000 mg | ORAL_CAPSULE | Freq: Every day | ORAL | 0 refills | Status: AC

## 2024-11-06 MED ORDER — ESTRADIOL 0.01 % VA CREA: 1.0000 g | TOPICAL_CREAM | VAGINAL | 6 refills | Status: AC

## 2024-11-06 MED ORDER — ESTRADIOL 0.075 MG/24HR TD PTTW: 1.0000 | MEDICATED_PATCH | TRANSDERMAL | 0 refills | Status: AC

## 2024-11-06 NOTE — Progress Notes (Signed)
" ° °  Jasmin Woods 1956/10/13 [id number]   History:  69 y.o. G5P3 presents for yearly medication check. Overdue for mammogram follow up from 8/24. Did not complete cologuard. Would like to increase estradiol  patch, having hot flashes.  Gynecologic History Patient's last menstrual period was 08/07/2012.   Contraception/Family planning: post menopausal status Last Pap: 2024. Results were: normal Last mammogram: 05/23/23. Results were: abnormal  Obstetric History OB History  Gravida Para Term Preterm AB Living  5 3 3  2 3   SAB IAB Ectopic Multiple Live Births      3    # Outcome Date GA Lbr Len/2nd Weight Sex Type Anes PTL Lv  5 Term 07/1992    M Vag-Spont   LIV  4 Term 02/1989    M Vag-Spont   LIV  3 Term 03/1987    M Vag-Spont   LIV  2 AB           1 AB                 No data to display           The following portions of the patient's history were reviewed and updated as appropriate: allergies, current medications, past family history, past medical history, past social history, past surgical history, and problem list.  Review of Systems  All other systems reviewed and are negative.   Past medical history, past surgical history, family history and social history were all reviewed and documented in the EPIC chart.  Exam:  Vitals:   11/06/24 1055  BP: 120/62  Pulse: 84  SpO2: (!) 87%  Weight: 166 lb (75.3 kg)  Height: 5' 8.5 (1.74 m)   Body mass index is 24.87 kg/m.  Physical Exam Constitutional:      Appearance: Normal appearance. She is normal weight.  Pulmonary:     Effort: Pulmonary effort is normal.  Genitourinary:    Comments: Declines pelvic and breast exam Neurological:     Mental Status: She is alert.  Psychiatric:        Mood and Affect: Mood normal.        Thought Content: Thought content normal.        Judgment: Judgment normal.      Darice Hoit, CMA present for exam  Assessment/Plan:   1. Vaginal dryness (Primary) - estradiol   (ESTRACE ) 0.01 % CREA vaginal cream; Place 1 g vaginally 3 (three) times a week.  Dispense: 42.5 g; Refill: 6  2. Hormone replacement therapy (HRT) Will refill for 3 months- no further refills until she follows up with mammogram - estradiol  (VIVELLE -DOT) 0.075 MG/24HR; Place 1 patch onto the skin 2 (two) times a week.  Dispense: 24 patch; Refill: 0 - progesterone  (PROMETRIUM ) 100 MG capsule; Take 1 capsule (100 mg total) by mouth at bedtime.  Dispense: 90 capsule; Refill: 0  3. Colon cancer screening - Cologuard    Return in about 1 year (around 11/06/2025) for LR B&P.  GINETTE COZIER B WHNP-BC 11:18 AM 11/06/2024 "

## 2024-11-16 ENCOUNTER — Other Ambulatory Visit: Payer: Self-pay | Admitting: Radiology

## 2024-11-16 DIAGNOSIS — Z1231 Encounter for screening mammogram for malignant neoplasm of breast: Secondary | ICD-10-CM

## 2024-11-21 ENCOUNTER — Encounter: Payer: Self-pay | Admitting: Radiology

## 2024-11-21 ENCOUNTER — Other Ambulatory Visit: Payer: Self-pay | Admitting: Radiology

## 2024-11-21 DIAGNOSIS — N6489 Other specified disorders of breast: Secondary | ICD-10-CM

## 2024-11-27 ENCOUNTER — Ambulatory Visit: Payer: Self-pay

## 2024-12-06 ENCOUNTER — Other Ambulatory Visit: Payer: Self-pay

## 2025-06-05 ENCOUNTER — Encounter: Admitting: Radiology
